# Patient Record
Sex: Female | Born: 1965 | Race: Asian | Hispanic: No | Marital: Married | Smoking: Never smoker
Health system: Southern US, Community
[De-identification: ages and names within clinical notes are randomized; demographics above are authoritative.]

## PROBLEM LIST (undated history)

## (undated) DIAGNOSIS — T7840XA Allergy, unspecified, initial encounter: Secondary | ICD-10-CM

## (undated) DIAGNOSIS — K219 Gastro-esophageal reflux disease without esophagitis: Secondary | ICD-10-CM

## (undated) DIAGNOSIS — R519 Headache, unspecified: Secondary | ICD-10-CM

## (undated) DIAGNOSIS — I10 Essential (primary) hypertension: Secondary | ICD-10-CM

## (undated) DIAGNOSIS — R51 Headache: Secondary | ICD-10-CM

## (undated) HISTORY — PX: TUBAL LIGATION: SHX77

## (undated) HISTORY — DX: Allergy, unspecified, initial encounter: T78.40XA

## (undated) HISTORY — DX: Essential (primary) hypertension: I10

---

## 1998-12-04 ENCOUNTER — Ambulatory Visit (HOSPITAL_COMMUNITY): Admission: RE | Admit: 1998-12-04 | Discharge: 1998-12-04 | Payer: Self-pay | Admitting: General Surgery

## 2000-05-04 ENCOUNTER — Other Ambulatory Visit: Admission: RE | Admit: 2000-05-04 | Discharge: 2000-05-04 | Payer: Self-pay | Admitting: Obstetrics and Gynecology

## 2000-12-08 ENCOUNTER — Emergency Department (HOSPITAL_COMMUNITY): Admission: EM | Admit: 2000-12-08 | Discharge: 2000-12-08 | Payer: Self-pay | Admitting: Emergency Medicine

## 2000-12-08 ENCOUNTER — Encounter: Payer: Self-pay | Admitting: Emergency Medicine

## 2007-05-21 ENCOUNTER — Emergency Department (HOSPITAL_COMMUNITY): Admission: EM | Admit: 2007-05-21 | Discharge: 2007-05-21 | Payer: Self-pay | Admitting: Emergency Medicine

## 2008-05-31 ENCOUNTER — Emergency Department (HOSPITAL_COMMUNITY): Admission: EM | Admit: 2008-05-31 | Discharge: 2008-06-01 | Payer: Self-pay | Admitting: Emergency Medicine

## 2010-07-27 LAB — CSF CELL COUNT WITH DIFFERENTIAL
RBC Count, CSF: 1 /mm3 — ABNORMAL HIGH
Tube #: 4
WBC, CSF: 1 /mm3 (ref 0–5)

## 2010-07-27 LAB — CSF CULTURE W GRAM STAIN: Culture: NO GROWTH

## 2010-07-27 LAB — GRAM STAIN: Gram Stain: NONE SEEN

## 2011-07-31 ENCOUNTER — Ambulatory Visit (INDEPENDENT_AMBULATORY_CARE_PROVIDER_SITE_OTHER): Payer: PRIVATE HEALTH INSURANCE | Admitting: Family Medicine

## 2011-07-31 VITALS — BP 145/88 | HR 80 | Temp 98.9°F | Resp 16 | Ht 60.25 in | Wt 132.4 lb

## 2011-07-31 DIAGNOSIS — J309 Allergic rhinitis, unspecified: Secondary | ICD-10-CM

## 2011-07-31 DIAGNOSIS — K120 Recurrent oral aphthae: Secondary | ICD-10-CM

## 2011-07-31 MED ORDER — FLUTICASONE PROPIONATE 50 MCG/ACT NA SUSP: 2.0000 | Freq: Every day | NASAL | Status: DC

## 2011-07-31 MED ORDER — TRIAMCINOLONE ACETONIDE 0.1 % EX CREA: TOPICAL_CREAM | CUTANEOUS | Status: DC

## 2011-07-31 MED ORDER — LIDOCAINE VISCOUS 2 % MT SOLN: 20.0000 mL | OROMUCOSAL | Status: AC | PRN

## 2011-07-31 NOTE — Progress Notes (Signed)
  Subjective:    Patient ID: Carrie Carpenter, female    DOB: 23-May-1965, 46 y.o.   MRN: [id number]  HPI Patient presents with 4 day history of facial congestion, PND and nocturnal cough that is not productive.  Nonsmoker  Review of Systems  Constitutional: Positive for fever, chills and fatigue.  HENT: Positive for ear pain and congestion.   Eyes: Positive for itching.  Respiratory: Positive for cough.   Skin: Positive for rash (pruritic rash (L) elbow).       Objective:   Physical Exam  Constitutional: She appears well-developed.  HENT:  Right Ear: External ear normal.  Left Ear: External ear normal.  Nose: Mucosal edema (pale purple hue) present.  Mouth/Throat:    Eyes: Conjunctivae are normal.  Neck: Neck supple.  Cardiovascular: Normal rate, regular rhythm and normal heart sounds.   Pulmonary/Chest: Effort normal and breath sounds normal.  Lymphadenopathy:    She has cervical adenopathy (prominant on (L)).  Neurological: She is alert.  Skin: Rash: eczematous changes (L) elbow.        Assessment & Plan:   1. Aphthous ulcer  triamcinolone cream (KENALOG) 0.1 %, lidocaine (XYLOCAINE) 2 % solution  2. Allergic rhinitis  fluticasone (FLONASE) 50 MCG/ACT nasal spray   Anticipatory guidance

## 2012-01-31 ENCOUNTER — Ambulatory Visit: Payer: Self-pay | Admitting: Physician Assistant

## 2012-01-31 ENCOUNTER — Encounter: Payer: Self-pay | Admitting: Physician Assistant

## 2012-01-31 VITALS — BP 154/104 | HR 61 | Temp 98.0°F | Resp 16 | Ht 61.0 in | Wt 128.4 lb

## 2012-01-31 DIAGNOSIS — R030 Elevated blood-pressure reading, without diagnosis of hypertension: Secondary | ICD-10-CM

## 2012-01-31 DIAGNOSIS — R51 Headache: Secondary | ICD-10-CM

## 2012-01-31 LAB — POCT UA - MICROSCOPIC ONLY
Bacteria, U Microscopic: NEGATIVE
Casts, Ur, LPF, POC: NEGATIVE
Crystals, Ur, HPF, POC: NEGATIVE

## 2012-01-31 LAB — GLUCOSE, POCT (MANUAL RESULT ENTRY): POC Glucose: 72 mg/dl (ref 70–99)

## 2012-01-31 LAB — POCT URINALYSIS DIPSTICK
Bilirubin, UA: NEGATIVE
Glucose, UA: NEGATIVE
Ketones, UA: NEGATIVE
Leukocytes, UA: NEGATIVE
Nitrite, UA: NEGATIVE
Protein, UA: NEGATIVE
Spec Grav, UA: 1.015
Urobilinogen, UA: 0.2
pH, UA: 6.5

## 2012-01-31 MED ORDER — TRAZODONE HCL 50 MG PO TABS: 25.0000 mg | ORAL_TABLET | Freq: Every evening | ORAL | Status: DC | PRN

## 2012-01-31 MED ORDER — LISINOPRIL 5 MG PO TABS: 5.0000 mg | ORAL_TABLET | Freq: Every day | ORAL | Status: DC
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
## 2012-01-31 NOTE — Patient Instructions (Addendum)
Please work on getting at least 6 hours of sleep at night, preferably 8-9 hours. Try the trazodone to help you relax and sleep.  Start with 1/2 tablet, and increase it to 1 tablet if needed.

## 2012-02-01 ENCOUNTER — Encounter: Payer: Self-pay | Admitting: Physician Assistant

## 2012-02-01 LAB — COMPREHENSIVE METABOLIC PANEL
Alkaline Phosphatase: 51 U/L (ref 39–117)
CO2: 25 mEq/L (ref 19–32)
Creat: 0.63 mg/dL (ref 0.50–1.10)
Glucose, Bld: 70 mg/dL (ref 70–99)
Sodium: 136 mEq/L (ref 135–145)
Total Bilirubin: 0.4 mg/dL (ref 0.3–1.2)
Total Protein: 7.1 g/dL (ref 6.0–8.3)

## 2012-02-01 LAB — CBC WITH DIFFERENTIAL/PLATELET
Basophils Absolute: 0 10*3/uL (ref 0.0–0.1)
Eosinophils Absolute: 0.1 10*3/uL (ref 0.0–0.7)
Eosinophils Relative: 3 % (ref 0–5)
HCT: 38.5 % (ref 36.0–46.0)
MCH: 26.3 pg (ref 26.0–34.0)
MCV: 80.9 fL (ref 78.0–100.0)
Monocytes Absolute: 0.4 10*3/uL (ref 0.1–1.0)
Platelets: 260 10*3/uL (ref 150–400)
RDW: 15.5 % (ref 11.5–15.5)

## 2012-02-01 LAB — TSH: TSH: 1.243 u[IU]/mL (ref 0.350–4.500)

## 2012-03-09 ENCOUNTER — Ambulatory Visit: Payer: PRIVATE HEALTH INSURANCE

## 2012-03-09 ENCOUNTER — Ambulatory Visit (INDEPENDENT_AMBULATORY_CARE_PROVIDER_SITE_OTHER): Payer: PRIVATE HEALTH INSURANCE | Admitting: Family Medicine

## 2012-03-09 VITALS — BP 130/98 | HR 99 | Temp 99.9°F | Resp 16 | Ht 61.0 in | Wt 126.8 lb

## 2012-03-09 DIAGNOSIS — R05 Cough: Secondary | ICD-10-CM

## 2012-03-09 DIAGNOSIS — J40 Bronchitis, not specified as acute or chronic: Secondary | ICD-10-CM

## 2012-03-09 DIAGNOSIS — R059 Cough, unspecified: Secondary | ICD-10-CM

## 2012-03-09 DIAGNOSIS — R509 Fever, unspecified: Secondary | ICD-10-CM

## 2012-03-09 LAB — POCT CBC
Granulocyte percent: 69.6 %G (ref 37–80)
HCT, POC: 40 % (ref 37.7–47.9)
Hemoglobin: 12.3 g/dL (ref 12.2–16.2)
Lymph, poc: 1 (ref 0.6–3.4)
MCH, POC: 26.1 pg — AB (ref 27–31.2)
MCHC: 30.8 g/dL — AB (ref 31.8–35.4)
MCV: 84.7 fL (ref 80–97)
MID (cbc): 0.3 (ref 0–0.9)
MPV: 10.4 fL (ref 0–99.8)
POC Granulocyte: 3.1 (ref 2–6.9)
POC LYMPH PERCENT: 22.6 %L (ref 10–50)
POC MID %: 7.8 %M (ref 0–12)
Platelet Count, POC: 206 10*3/uL (ref 142–424)
RBC: 4.72 M/uL (ref 4.04–5.48)
RDW, POC: 15.1 %
WBC: 4.4 10*3/uL — AB (ref 4.6–10.2)

## 2012-03-09 LAB — POCT INFLUENZA A/B
Influenza A, POC: NEGATIVE
Influenza B, POC: NEGATIVE

## 2012-03-09 MED ORDER — AZITHROMYCIN 250 MG PO TABS: ORAL_TABLET | ORAL | Status: DC

## 2012-03-09 MED ORDER — HYDROCOD POLST-CHLORPHEN POLST 10-8 MG/5ML PO LQCR: 5.0000 mL | Freq: Two times a day (BID) | ORAL | Status: DC | PRN

## 2012-03-09 NOTE — Progress Notes (Signed)
  Subjective:    Patient ID: Carrie Carpenter, female    DOB: October 21, 1965, 46 y.o.   MRN: [id number]  HPI 46 year old female presents with almost 3 week history of cough.  Was given Cheratussin by the nurse at her work, but states that has not helped her symptoms. She has also been taking OTC cough syrups and Nyquil, which have not helped either.  States last night she developed fever.  Cough is not productive but is persistent and worse at night.  Denies sore throat, otalgia, nasal congestion, nausea, or vomiting.  She is otherwise healthy with no other complaints today.  Denies any known sick contacts.     Review of Systems  Constitutional: Positive for fever and chills.  HENT: Negative for ear pain, congestion, sore throat, rhinorrhea, neck pain, neck stiffness and postnasal drip.   Eyes: Negative for discharge.  Respiratory: Positive for cough. Negative for chest tightness, shortness of breath and wheezing.   Cardiovascular: Negative for chest pain.  All other systems reviewed and are negative.       Objective:   Physical Exam  Constitutional: She is oriented to person, place, and time. She appears well-developed and well-nourished.  HENT:  Head: Normocephalic and atraumatic.  Right Ear: External ear normal.  Left Ear: External ear normal.  Mouth/Throat: Oropharynx is clear and moist. No oropharyngeal exudate.  Eyes: Conjunctivae normal are normal.  Neck: Normal range of motion.  Cardiovascular: Normal rate, regular rhythm and normal heart sounds.   Pulmonary/Chest: Effort normal and breath sounds normal.  Lymphadenopathy:    She has no cervical adenopathy.  Neurological: She is alert and oriented to person, place, and time.  Psychiatric: She has a normal mood and affect. Her behavior is normal. Judgment and thought content normal.     [facility] reading (PRIMARY) by  Dr. Patsy Lager as normal chest x-ray.       Assessment & Plan:   1. Cough  DG Chest 2 View, azithromycin (ZITHROMAX) 250  MG tablet, chlorpheniramine-HYDROcodone (TUSSIONEX PENNKINETIC ER) 10-8 MG/5ML LQCR  2. Fever  POCT CBC, POCT Influenza A/B  3. Bronchitis  azithromycin (ZITHROMAX) 250 MG tablet   Will cover with Zpack Tussionex qhs prn cough Increase fluids and rest Follow up if symptoms persist or if fever >100.0

## 2012-03-13 ENCOUNTER — Encounter: Payer: Self-pay | Admitting: Physician Assistant

## 2012-03-13 ENCOUNTER — Ambulatory Visit (INDEPENDENT_AMBULATORY_CARE_PROVIDER_SITE_OTHER): Payer: PRIVATE HEALTH INSURANCE | Admitting: Physician Assistant

## 2012-03-13 VITALS — BP 116/81 | HR 77 | Temp 98.1°F | Resp 16 | Ht 62.0 in | Wt 124.0 lb

## 2012-03-13 DIAGNOSIS — G4700 Insomnia, unspecified: Secondary | ICD-10-CM

## 2012-03-13 DIAGNOSIS — I10 Essential (primary) hypertension: Secondary | ICD-10-CM

## 2012-03-13 NOTE — Progress Notes (Signed)
Subjective:    Patient ID: Carrie Carpenter, female    DOB: 12-03-1965, 46 y.o.   MRN: [id number]  HPI  This 46 y.o. female presents for evaluation of HTN, HA and insomnia.  She is accompanied by her son, presumably to translate/assist in communication, but he does not participate in any way, and spends the visit on his phone.  Her HA is better since last visit.  However, she developed an illness 5 days ago.  She was evaluated here and diagnosed with bronchitis.  She reports symptom improvement since starting azithromycin and cough syrup, though she is not yet well. I note that she reported 3 weeks of cough to the provider who evaluated her acute symptoms, and wonder if she may be reacting to the ACEI started at her last visit.Sleeping only 4-5 hours at night. The Trazodone works, but she doesn't take it because of residual somnolence in the mornings.  She did not try reducing the dose to 1/2 tablet.  Past Medical History  Diagnosis Date  . Hypertension   . Allergy     Past Surgical History  Procedure Date  . Tubal ligation     Prior to Admission medications   Medication Sig Start Date End Date Taking? Authorizing Provider  azithromycin (ZITHROMAX) 250 MG tablet Take 2 tabs PO x 1 dose, then 1 tab PO QD x 4 days 03/09/12   Carrie Nay, PA-C  chlorpheniramine-HYDROcodone (TUSSIONEX PENNKINETIC ER) 10-8 MG/5ML LQCR Take 5 mLs by mouth every 12 (twelve) hours as needed (cough). 03/09/12   Carrie Jaquita Rector, PA-C  DiphenhydrAMINE HCl (BENADRYL ALLERGY PO) Take 1 tablet by mouth daily as needed.    Historical Provider, MD  fluticasone (FLONASE) 50 MCG/ACT nasal spray Place 2 sprays into the nose daily. 07/31/11 07/30/12  Carrie Davenport, MD  lisinopril (PRINIVIL,ZESTRIL) 5 MG tablet Take 1 tablet (5 mg total) by mouth daily. 01/31/12   Carrie Benninger S Braun Rocca, PA-C  traZODone (DESYREL) 50 MG tablet Take 0.5-1 tablets (25-50 mg total) by mouth at bedtime as needed for sleep. 01/31/12   Carrie Sardinha Tessa Lerner,  PA-C    No Known Allergies  History   Social History  . Marital Status: Married    Spouse Name: Carrie Carpenter    Number of Children: 3  . 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 Smoking status: Never Smoker   . Smokeless tobacco: Never Used  . Alcohol Use: No  . Drug Use: No   Other Topics Concern  . Not on file   Social History Narrative   Lives with husband and 3 children. From [place]; in the [place] since 1989.    History reviewed. No pertinent family history.  Review of Systems As above.  No chest pain, SOB,dizziness, vision change, N/V, diarrhea, constipation, dysuria, urinary urgency or frequency, myalgias, arthralgias or rash.     Objective:   Physical Exam Blood pressure 116/81, pulse 77, temperature 98.1 F (36.7 C), resp. rate 16, height 5\' 2"  (1.575 m), weight 124 lb (56.246 kg), last menstrual period 02/27/2012. Body mass index is 22.68 kg/(m^2). Well-developed, well nourished [place] female who is awake, alert and oriented, in NAD. HEENT: [place]/AT, sclera and conjunctiva are clear.   Neck: supple, non-tender, no lymphadenopathy, thyromegaly. Heart: RRR, no murmur Lungs: normal effort, CTA Extremities: no cyanosis, clubbing or edema. Skin: warm and dry without rash. Psychologic: good mood  and appropriate affect, normal speech and behavior.     Assessment & Plan:   1. HTN (hypertension)   2. Insomnia    Continue lisinopril for now, though if her cough persists, we may need to change that to an alternate agent.  While she is no longer symptomatic from a HA perspective, she is still not sleeping much.  She may try taking 1/2 tablet of the trazodone to see if it effective but with less morning effect.  RTC 3 months, plan CMET, sooner PRN.

## 2012-03-13 NOTE — Patient Instructions (Addendum)
Try taking 1/2 of the Trazodone tablet at bedtime to help you sleep-it may not give you the morning symptoms at the lower dose.

## 2012-03-23 ENCOUNTER — Ambulatory Visit (INDEPENDENT_AMBULATORY_CARE_PROVIDER_SITE_OTHER): Payer: PRIVATE HEALTH INSURANCE | Admitting: Family Medicine

## 2012-03-23 ENCOUNTER — Encounter: Payer: Self-pay | Admitting: Family Medicine

## 2012-03-23 VITALS — BP 130/80 | HR 81 | Temp 99.0°F | Resp 17 | Ht 61.0 in | Wt 121.0 lb

## 2012-03-23 DIAGNOSIS — R059 Cough, unspecified: Secondary | ICD-10-CM

## 2012-03-23 DIAGNOSIS — Z789 Other specified health status: Secondary | ICD-10-CM

## 2012-03-23 DIAGNOSIS — Z609 Problem related to social environment, unspecified: Secondary | ICD-10-CM

## 2012-03-23 DIAGNOSIS — R05 Cough: Secondary | ICD-10-CM

## 2012-03-23 DIAGNOSIS — J45909 Unspecified asthma, uncomplicated: Secondary | ICD-10-CM

## 2012-03-23 MED ORDER — HYDROCODONE-HOMATROPINE 5-1.5 MG/5ML PO SYRP: 5.0000 mL | ORAL_SOLUTION | ORAL | Status: DC | PRN

## 2012-03-23 MED ORDER — PREDNISONE 20 MG PO TABS: ORAL_TABLET | ORAL | Status: DC

## 2012-03-23 MED ORDER — ALBUTEROL SULFATE (2.5 MG/3ML) 0.083% IN NEBU
2.5000 mg | INHALATION_SOLUTION | Freq: Once | RESPIRATORY_TRACT | Status: AC
  Administered 2012-03-23: 2.5 mg via RESPIRATORY_TRACT

## 2012-03-23 MED ORDER — ALBUTEROL SULFATE HFA 108 (90 BASE) MCG/ACT IN AERS: INHALATION_SPRAY | RESPIRATORY_TRACT | Status: DC

## 2012-03-23 NOTE — Progress Notes (Signed)
Subjective: 46 year old [place] lady who is here with a cough. She came in first time almost 3 weeks ago. She was evaluated with a CBC and chest x-ray and flu test. She was treated with Zithromax. She continued having a cough came back in about 2 weeks ago. She  received further symptomatic treatment. Discussion was held that if the cough persisted they might have to stop her lisinopril. She apparently stopped her lisinopril last week. She does not smoke. She had a fever a week ago. She coughs so hard that she vomits. She has not been able to sleep well because of the coughing. She did continue to work this week. She has not had a lot of trouble with persistent coughs of the years. Has not traveled abroad.  Objective: Pleasant lady in no major distress. TMs normal. Throat clear. Neck supple without nodes. Chest is clear to auscultation. Heart regular without murmurs. Abdomen soft without mass or tenderness. Last menstrual period was about a month ago but she has had her tubes tied.  Peak flow 380, not very reproducable, desired 420  Assessment: Bronchitis Sleep disturbance Vomiting secondary to cough  Plan: Trial of albuterol  Post treatment feels better but 320 peak flow.   This is actually a better reading, and the 380 was maybe factitious.  Albuterol, prednisone, cough med

## 2012-03-23 NOTE — Patient Instructions (Signed)
Use the inhaler 2 puffs 4 times daily at breakfast, lunch, supper and bedtime  Take the prednisone as instructed. Take 3 pills today, 3 Saturday, to Sunday, 2 Monday, one daily, and 1 Wednesday  Use the cough syrup primarily when you're not working because it will make you sleepy  If you are running more fevers or coughing up more mucus or getting more shortness of breath return for one more recheck

## 2012-03-30 ENCOUNTER — Ambulatory Visit (INDEPENDENT_AMBULATORY_CARE_PROVIDER_SITE_OTHER): Payer: PRIVATE HEALTH INSURANCE | Admitting: Family Medicine

## 2012-03-30 ENCOUNTER — Ambulatory Visit: Payer: PRIVATE HEALTH INSURANCE

## 2012-03-30 VITALS — BP 128/74 | HR 72 | Temp 98.4°F | Resp 18 | Ht 62.25 in | Wt 125.0 lb

## 2012-03-30 DIAGNOSIS — S239XXA Sprain of unspecified parts of thorax, initial encounter: Secondary | ICD-10-CM

## 2012-03-30 DIAGNOSIS — S139XXA Sprain of joints and ligaments of unspecified parts of neck, initial encounter: Secondary | ICD-10-CM

## 2012-03-30 DIAGNOSIS — S161XXA Strain of muscle, fascia and tendon at neck level, initial encounter: Secondary | ICD-10-CM

## 2012-03-30 DIAGNOSIS — S29012A Strain of muscle and tendon of back wall of thorax, initial encounter: Secondary | ICD-10-CM

## 2012-03-30 DIAGNOSIS — M542 Cervicalgia: Secondary | ICD-10-CM

## 2012-03-30 MED ORDER — METAXALONE 800 MG PO TABS: 800.0000 mg | ORAL_TABLET | Freq: Three times a day (TID) | ORAL | Status: DC

## 2012-03-30 MED ORDER — OXAPROZIN 600 MG PO TABS: ORAL_TABLET | ORAL | Status: DC
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
## 2012-03-30 NOTE — Patient Instructions (Signed)
Take the Skelaxin muscle relaxant one half to one pill 3 times daily  Take the Daypro one twice daily for pain and inflammation. If continued to hurt to take some Tylenol in addition to this, but do not take Advil, Aleve, ibuprofen with the Daypro.  Do stretching exercises  Return if worse or no better

## 2012-04-07 ENCOUNTER — Other Ambulatory Visit: Payer: Self-pay | Admitting: Family Medicine

## 2012-04-10 ENCOUNTER — Other Ambulatory Visit: Payer: Self-pay | Admitting: Family Medicine

## 2012-06-19 ENCOUNTER — Ambulatory Visit: Payer: PRIVATE HEALTH INSURANCE | Admitting: Physician Assistant

## 2013-03-19 ENCOUNTER — Ambulatory Visit: Payer: Self-pay | Admitting: *Deleted

## 2013-08-11 ENCOUNTER — Emergency Department (HOSPITAL_COMMUNITY)
Admission: EM | Admit: 2013-08-11 | Discharge: 2013-08-12 | Disposition: A | Discharge status: Home / Self Care | Payer: PRIVATE HEALTH INSURANCE | Attending: Emergency Medicine | Admitting: Emergency Medicine

## 2013-08-11 ENCOUNTER — Encounter (HOSPITAL_COMMUNITY): Payer: Self-pay | Admitting: Emergency Medicine

## 2013-08-11 DIAGNOSIS — IMO0002 Reserved for concepts with insufficient information to code with codable children: Secondary | ICD-10-CM | POA: Insufficient documentation

## 2013-08-11 DIAGNOSIS — M62838 Other muscle spasm: Secondary | ICD-10-CM

## 2013-08-11 DIAGNOSIS — Z79899 Other long term (current) drug therapy: Secondary | ICD-10-CM | POA: Insufficient documentation

## 2013-08-11 DIAGNOSIS — R519 Headache, unspecified: Secondary | ICD-10-CM

## 2013-08-11 DIAGNOSIS — J069 Acute upper respiratory infection, unspecified: Secondary | ICD-10-CM

## 2013-08-11 DIAGNOSIS — R110 Nausea: Secondary | ICD-10-CM | POA: Insufficient documentation

## 2013-08-11 DIAGNOSIS — R51 Headache: Secondary | ICD-10-CM

## 2013-08-11 DIAGNOSIS — I10 Essential (primary) hypertension: Secondary | ICD-10-CM | POA: Insufficient documentation

## 2013-08-11 MED ORDER — METOCLOPRAMIDE HCL 5 MG/ML IJ SOLN
10.0000 mg | Freq: Once | INTRAMUSCULAR | Status: AC
  Administered 2013-08-12: 10 mg via INTRAMUSCULAR
  Filled 2013-08-11: qty 2

## 2013-08-11 MED ORDER — DIPHENHYDRAMINE HCL 50 MG/ML IJ SOLN
25.0000 mg | Freq: Once | INTRAMUSCULAR | Status: AC
  Administered 2013-08-12: 25 mg via INTRAMUSCULAR
  Filled 2013-08-11: qty 1

## 2013-08-11 NOTE — ED Notes (Signed)
Pt arrived to the Ed with a complaint of a headache.  Pt has a history of migraines.  Pt states headache started around 1400.  Pt states she has seen a neurologist without relief.  Pt has a cough.  Pt is hypertensive in triage.

## 2013-08-11 NOTE — ED Notes (Signed)
Pt presents with c/o headache since 2pm this afternoon.

## 2013-08-11 NOTE — ED Provider Notes (Signed)
CSN: [id number]     Arrival date & time 08/11/13  2152 History   First MD Initiated Contact with Patient 08/11/13 2303     Chief Complaint  Patient presents with  . Migraine     (Consider location/radiation/quality/duration/timing/severity/associated sxs/prior Treatment) HPI Comments: Patient is a 48 year old female medical history significant for hypertension, headaches, seasonal allergies presenting to the emergency department for two complaints. Patient's first complaint is bilateral next item that began around 1 PM this afternoon. Patient states that pain is now radiating into her head. She has tried Tylenol with no improvement of her symptoms. Patient states typically her headaches are on top of her head rather than posterior. She is unsure whether she slept wrong on her neck. She does have associated nausea without vomiting and Patient's second complaint is one week of nonproductive cough with associated rhinorrhea sneezing, nasal congestion. She states her coughing is worse at night. Has not tried anything over-the-counter for her symptoms. She does have family who has had upper respiratory infections. She denies any fevers, chills, chest pain, shortness of breath, abdominal pain, neck stiffness  Patient is a 48 y.o. female presenting with migraines.  Migraine Associated symptoms include congestion, coughing and headaches. Pertinent negatives include no chills, fever, numbness or weakness.    Past Medical History  Diagnosis Date  . Hypertension   . Allergy    Past Surgical History  Procedure Laterality Date  . Tubal ligation     History reviewed. No pertinent family history. History  Substance Use Topics  . Smoking status: Never Smoker   . Smokeless tobacco: Never Used  . Alcohol Use: No   OB History   Grav Para Term Preterm Abortions TAB SAB Ect Mult Living                 Review of Systems  Constitutional: Negative for fever and chills.  HENT: Positive for congestion,  rhinorrhea and sneezing.   Respiratory: Positive for cough.   Neurological: Positive for headaches. Negative for weakness and numbness.  All other systems reviewed and are negative.     Allergies  Review of patient's allergies indicates no known allergies.  Home Medications   Prior to Admission medications   Medication Sig Start Date End Date Taking? Authorizing Provider  albuterol (PROVENTIL HFA;VENTOLIN HFA) 108 (90 BASE) MCG/ACT inhaler Use 2 puffs four times a day as instructed for lungs 03/23/12   Posey Boyer, MD  azithromycin (ZITHROMAX) 250 MG tablet Take 2 tabs PO x 1 dose, then 1 tab PO QD x 4 days 03/09/12   Collene Leyden, PA-C  chlorpheniramine-HYDROcodone (TUSSIONEX PENNKINETIC ER) 10-8 MG/5ML LQCR Take 5 mLs by mouth every 12 (twelve) hours as needed (cough). 03/09/12   Heather Elnora Morrison, PA-C  DiphenhydrAMINE HCl (BENADRYL ALLERGY PO) Take 1 tablet by mouth daily as needed.    Historical Provider, MD  fluticasone (FLONASE) 50 MCG/ACT nasal spray Place 2 sprays into the nose daily. 07/31/11 07/30/12  Hayden Rasmussen, MD  HYDROcodone-homatropine [facility]) 5-1.5 MG/5ML syrup Take 5 mLs by mouth every 4 (four) hours as needed for cough. 03/23/12   Posey Boyer, MD  lisinopril (PRINIVIL,ZESTRIL) 5 MG tablet Take 1 tablet (5 mg total) by mouth daily. 01/31/12   Chelle S Jeffery, PA-C  metaxalone (SKELAXIN) 800 MG tablet Take 1 tablet (800 mg total) by mouth 3 (three) times daily. 03/30/12   Posey Boyer, MD  oxaprozin (DAYPRO) 600 MG tablet Take one twice daily with food for  pain and inflammation 03/30/12   Posey Boyer, MD  predniSONE (DELTASONE) 20 MG tablet Take 3 daily for 2 days, then 2 daily for 2 days, then 1 daily for 2 days for lungs 03/23/12   Posey Boyer, MD  traZODone (DESYREL) 50 MG tablet Take 0.5-1 tablets (25-50 mg total) by mouth at bedtime as needed for sleep. 01/31/12   Chelle S Jeffery, PA-C   BP 177/92  Pulse 78  Temp(Src) 97.9 F (36.6 C) (Oral)   Resp 20  SpO2 100%  LMP 07/19/2013 Physical Exam  Nursing note and vitals reviewed. Constitutional: She is oriented to person, place, and time. She appears well-developed and well-nourished. No distress.  Patient sitting in the room talking with family in room w/ all lights on. In no acute distress.  HENT:  Head: Normocephalic and atraumatic.  Right Ear: External ear normal.  Left Ear: External ear normal.  Nose: Nose normal.  Mouth/Throat: Oropharynx is clear and moist. No oropharyngeal exudate.  Eyes: Conjunctivae and EOM are normal. Pupils are equal, round, and reactive to light.  Neck: Normal range of motion and full passive range of motion without pain. Neck supple. Muscular tenderness present. No spinous process tenderness present. No rigidity. No edema, no erythema and normal range of motion present.    Cardiovascular: Normal rate, regular rhythm, normal heart sounds and intact distal pulses.   Pulmonary/Chest: Effort normal and breath sounds normal. No stridor. No respiratory distress.  Dry cough appreciated on examination.  Abdominal: Soft. There is no tenderness.  Musculoskeletal: Normal range of motion. She exhibits no edema.       Cervical back: She exhibits spasm. She exhibits normal range of motion, no tenderness, no bony tenderness, no swelling, no deformity, no laceration, no pain and normal pulse.  Lymphadenopathy:    She has no cervical adenopathy.  Neurological: She is alert and oriented to person, place, and time. She has normal strength. No cranial nerve deficit. Gait normal. GCS eye subscore is 4. GCS verbal subscore is 5. GCS motor subscore is 6.  Sensation grossly intact.  No pronator drift.  Bilateral heel-knee-shin intact.  Skin: Skin is warm and dry. She is not diaphoretic.    ED Course  Procedures (including critical care time) Medications  diazepam (VALIUM) tablet 5 mg (not administered)  metoCLOPramide (REGLAN) injection 10 mg (10 mg Intramuscular  Given 08/12/13 0001)  diphenhydrAMINE (BENADRYL) injection 25 mg (25 mg Intramuscular Given 08/12/13 0003)    Labs Review Labs Reviewed - No data to display  Imaging Review Dg Chest 2 View  08/12/2013   CLINICAL DATA:  Cough and congestion  EXAM: CHEST  2 VIEW  COMPARISON:  03/09/2012  FINDINGS: Normal heart size and mediastinal contours. No acute infiltrate or edema. No effusion or pneumothorax. No acute osseous findings.  IMPRESSION: No active cardiopulmonary disease.   Electronically Signed   By: Jorje Guild M.D.   On: 08/12/2013 00:17     EKG Interpretation None      MDM   Final diagnoses:  URI (upper respiratory infection)  Muscle spasms of neck  Headache    Filed Vitals:   08/12/13 0005  BP: 177/92  Pulse: 78  Temp:   Resp: 20   Afebrile, NAD, non-toxic appearing, AAOx4.   1) Muscle spasm and headache: Pt is afebrile with no focal neuro deficits, nuchal rigidity, or change in vision. Muscle spasm appreciated the bilateral cervical paraspinal muscles. No posterior midline tenderness. Symptoms improved while in ED.  2)  URI: Pt CXR negative for acute infiltrate. Patients symptoms are consistent with URI, likely viral etiology. Discussed that antibiotics are not indicated for viral infections.   Pt will be discharged with symptomatic treatment. Return Precautions discussed. Verbalizes understanding and is agreeable with plan. Pt is hemodynamically stable & in NAD prior to [place], PA-C 08/12/13 0122

## 2013-08-12 ENCOUNTER — Emergency Department (HOSPITAL_COMMUNITY): Payer: PRIVATE HEALTH INSURANCE

## 2013-08-12 MED ORDER — LORATADINE 10 MG PO TABS: 10.0000 mg | ORAL_TABLET | Freq: Every day | ORAL | Status: DC

## 2013-08-12 MED ORDER — DIAZEPAM 5 MG PO TABS
5.0000 mg | ORAL_TABLET | Freq: Once | ORAL | Status: AC
  Administered 2013-08-12: 5 mg via ORAL
  Filled 2013-08-12: qty 1

## 2013-08-12 MED ORDER — DIAZEPAM 5 MG PO TABS: 5.0000 mg | ORAL_TABLET | Freq: Two times a day (BID) | ORAL | Status: DC

## 2013-08-12 MED ORDER — GUAIFENESIN 100 MG/5ML PO LIQD: 100.0000 mg | ORAL | Status: DC | PRN

## 2013-08-12 NOTE — ED Provider Notes (Signed)
Medical screening examination/treatment/procedure(s) were performed by non-physician practitioner and as supervising physician I was immediately available for consultation/collaboration.   EKG Interpretation None        Jaydenn Boccio, MD 08/12/13 0532 

## 2013-08-12 NOTE — Discharge Instructions (Signed)
Please follow up with your primary care physician in 1-2 days. If you do not have one please call the [place] number listed above. Please take pain medication and/or muscle relaxants as prescribed and as needed for pain. Please do not drive on narcotic pain medication or on muscle relaxants. Please use robitussin and claritin as prescribed. Please read all discharge instructions and return precautions.   Muscle Cramps and Spasms Muscle cramps and spasms occur when a muscle or muscles tighten and you have no control over this tightening (involuntary muscle contraction). They are a common problem and can develop in any muscle. The most common place is in the calf muscles of the leg. Both muscle cramps and muscle spasms are involuntary muscle contractions, but they also have differences:   Muscle cramps are sporadic and painful. They may last a few seconds to a quarter of an hour. Muscle cramps are often more forceful and last longer than muscle spasms.  Muscle spasms may or may not be painful. They may also last just a few seconds or much longer. CAUSES  It is uncommon for cramps or spasms to be due to a serious underlying problem. In many cases, the cause of cramps or spasms is unknown. Some common causes are:   Overexertion.   Overuse from repetitive motions (doing the same thing over and over).   Remaining in a certain position for a long period of time.   Improper preparation, form, or technique while performing a sport or activity.   Dehydration.   Injury.   Side effects of some medicines.   Abnormally low levels of the salts and ions in your blood (electrolytes), especially potassium and calcium. This could happen if you are taking water pills (diuretics) or you are pregnant.  Some underlying medical problems can make it more likely to develop cramps or spasms. These include, but are not limited to:   Diabetes.   Parkinson disease.   Hormone  disorders, such as thyroid problems.   Alcohol abuse.   Diseases specific to muscles, joints, and bones.   Blood vessel disease where not enough blood is getting to the muscles.  HOME CARE INSTRUCTIONS   Stay well hydrated. Drink enough water and fluids to keep your urine clear or pale yellow.  It may be helpful to massage, stretch, and relax the affected muscle.  For tight or tense muscles, use a warm towel, heating pad, or hot shower water directed to the affected area.  If you are sore or have pain after a cramp or spasm, applying ice to the affected area may relieve discomfort.  Put ice in a plastic bag.  Place a towel between your skin and the bag.  Leave the ice on for 15-20 minutes, 03-04 times a day.  Medicines used to treat a known cause of cramps or spasms may help reduce their frequency or severity. Only take over-the-counter or prescription medicines as directed by your caregiver. SEEK MEDICAL CARE IF:  Your cramps or spasms get more severe, more frequent, or do not improve over time.  MAKE SURE YOU:   Understand these instructions.  Will watch your condition.  Will get help right away if you are not doing well or get worse. Document Released: 09/17/2001 Document Revised: 07/23/2012 Document Reviewed: 03/14/2012 [facility] Patient Information 2014 [place], [place].   Upper Respiratory Infection, Adult An upper respiratory infection (URI) is also known as the common cold. It is often caused by a type of germ (virus).  Colds are easily spread (contagious). You can pass it to others by kissing, coughing, sneezing, or drinking out of the same glass. Usually, you get better in 1 or 2 weeks.  HOME CARE   Only take medicine as told by your doctor.  Use a warm mist humidifier or breathe in steam from a hot shower.  Drink enough water and fluids to keep your pee (urine) clear or pale yellow.  Get plenty of rest.  Return to work when your temperature is back to  normal or as told by your doctor. You may use a face mask and wash your hands to stop your cold from spreading. GET HELP RIGHT AWAY IF:   After the first few days, you feel you are getting worse.  You have questions about your medicine.  You have chills, shortness of breath, or brown or red spit (mucus).  You have yellow or brown snot (nasal discharge) or pain in the face, especially when you bend forward.  You have a fever, puffy (swollen) neck, pain when you swallow, or white spots in the back of your throat.  You have a bad headache, ear pain, sinus pain, or chest pain.  You have a high-pitched whistling sound when you breathe in and out (wheezing).  You have a lasting cough or cough up blood.  You have sore muscles or a stiff neck. MAKE SURE YOU:   Understand these instructions.  Will watch your condition.  Will get help right away if you are not doing well or get worse. Document Released: 09/14/2007 Document Revised: 06/20/2011 Document Reviewed: 08/02/2010 [facility] Patient Information 2014 [place], [place].

## 2013-08-15 ENCOUNTER — Ambulatory Visit (INDEPENDENT_AMBULATORY_CARE_PROVIDER_SITE_OTHER): Payer: PRIVATE HEALTH INSURANCE | Admitting: Family Medicine

## 2013-08-15 VITALS — BP 166/90 | HR 96 | Temp 98.5°F | Resp 16 | Ht 61.0 in | Wt 128.0 lb

## 2013-08-15 DIAGNOSIS — J302 Other seasonal allergic rhinitis: Secondary | ICD-10-CM

## 2013-08-15 DIAGNOSIS — J309 Allergic rhinitis, unspecified: Secondary | ICD-10-CM

## 2013-08-15 DIAGNOSIS — J45909 Unspecified asthma, uncomplicated: Secondary | ICD-10-CM

## 2013-08-15 DIAGNOSIS — I10 Essential (primary) hypertension: Secondary | ICD-10-CM

## 2013-08-15 DIAGNOSIS — R059 Cough, unspecified: Secondary | ICD-10-CM

## 2013-08-15 DIAGNOSIS — J019 Acute sinusitis, unspecified: Secondary | ICD-10-CM

## 2013-08-15 DIAGNOSIS — R05 Cough: Secondary | ICD-10-CM

## 2013-08-15 MED ORDER — BENZONATATE 100 MG PO CAPS: 200.0000 mg | ORAL_CAPSULE | Freq: Two times a day (BID) | ORAL | Status: DC | PRN

## 2013-08-15 MED ORDER — LISINOPRIL 5 MG PO TABS: 5.0000 mg | ORAL_TABLET | Freq: Every day | ORAL | Status: DC

## 2013-08-15 MED ORDER — MONTELUKAST SODIUM 10 MG PO TABS: 10.0000 mg | ORAL_TABLET | Freq: Every day | ORAL | Status: DC

## 2013-08-15 MED ORDER — HYDROCODONE-HOMATROPINE 5-1.5 MG/5ML PO SYRP: 5.0000 mL | ORAL_SOLUTION | Freq: Four times a day (QID) | ORAL | Status: DC | PRN

## 2013-08-15 MED ORDER — AMOXICILLIN-POT CLAVULANATE 875-125 MG PO TABS: 1.0000 | ORAL_TABLET | Freq: Two times a day (BID) | ORAL | Status: DC

## 2013-08-15 NOTE — Progress Notes (Signed)
Chief Complaint:  Chief Complaint  Patient presents with  . Cough    X 3 days  . Sore Throat    X 3 days  . Nasal Congestion    X 3 days    HPI: Carrie Carpenter is a 48 y.o. female who is here for 3 day history of  Sore throat and sinus congestion, she had a severe sinus headache so went to the ER on 08/11/13. Chest xray was normal on that visit.  Her headache is resolved now. However her cough is bothering her, she has some productive yellow sputum. She denies fevers or chills, just feels tired overall. She has been taking claritin without relief, she does have some facial tenderness She has not been taking her  BP meds, she has not been on it for several months. She denies having a dry cough with her ACEI when she was taking it.    EXAM:  CHEST 2 VIEW  COMPARISON: 03/09/2012  FINDINGS:  Normal heart size and mediastinal contours. No acute infiltrate or  edema. No effusion or pneumothorax. No acute osseous findings.  IMPRESSION:  No active cardiopulmonary disease.  Electronically Signed  By: Jorje Guild M.D.  On: 08/12/2013 00:17    Past Medical History  Diagnosis Date  . Hypertension   . Allergy    Past Surgical History  Procedure Laterality Date  . Tubal ligation     History   Social History  . Marital Status: Married    Spouse Name: Narom    Number of Children: 3  . Years of Education: 5   Occupational History  . 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 Smoking status: Never Smoker   . Smokeless tobacco: Never Used  . Alcohol Use: No  . Drug Use: No  . Sexual Activity: None   Other Topics Concern  . None   Social History Narrative   Lives with husband and 3 children. From [place]; in the [place] since 1989.   History reviewed. No pertinent family history. No Known Allergies Prior to Admission medications   Medication Sig Start Date End Date Taking? Authorizing Provider  diazepam (VALIUM) 5 MG  tablet Take 1 tablet (5 mg total) by mouth 2 (two) times daily. 08/12/13  Yes Jennifer L Piepenbrink, PA-C  loratadine (CLARITIN) 10 MG tablet Take 1 tablet (10 mg total) by mouth daily. 08/12/13  Yes Jennifer L Piepenbrink, PA-C  amoxicillin-clavulanate (AUGMENTIN) 875-125 MG per tablet Take 1 tablet by mouth 2 (two) times daily. 08/15/13   Sayward Horvath P Jaret Coppedge, DO  benzonatate (TESSALON) 100 MG capsule Take 2 capsules (200 mg total) by mouth 2 (two) times daily as needed for cough. 08/15/13   Kora Groom P [place] Warriner, DO  guaiFENesin (ROBITUSSIN) 100 MG/5ML liquid Take 5-10 mLs (100-200 mg total) by mouth every 4 (four) hours as needed for cough. 08/12/13   Jennifer L Piepenbrink, PA-C  HYDROcodone-homatropine (HYCODAN) 5-1.5 MG/5ML syrup Take 5 mLs by mouth every 6 (six) hours as needed for cough. 08/15/13   Noel Rodier P Lutricia Widjaja, DO  lisinopril (PRINIVIL,ZESTRIL) 5 MG tablet Take 1 tablet (5 mg total) by mouth daily. 08/15/13   Radames Mejorado P Arin Peral, DO  montelukast (SINGULAIR) 10 MG tablet Take 1 tablet (10 mg total) by mouth at bedtime. 08/15/13   Zenaida Tesar P Abdifatah Colquhoun, DO     ROS: The patient denies fevers, chills, night sweats, unintentional weight loss, chest pain, palpitations, wheezing, dyspnea  on exertion, nausea, vomiting, abdominal pain, dysuria, hematuria, melena, numbness, weakness, or tingling.   All other systems have been reviewed and were otherwise negative with the exception of those mentioned in the HPI and as above.    PHYSICAL EXAM: Filed Vitals:   08/15/13 0956  BP: 166/90  Pulse: 96  Temp: 98.5 F (36.9 C)  Resp: 16   Filed Vitals:   08/15/13 0956  Height: 5\' 1"  (1.549 m)  Weight: 128 lb (58.06 kg)   Body mass index is 24.2 kg/(m^2).  General: Alert, no acute distress HEENT:  Normocephalic, atraumatic, oropharynx patent. EOMI, PERRLA, + sinus tenderness, TM nl, no exudates.  Cardiovascular:  Regular rate and rhythm, no rubs murmurs or gallops.  No Carotid bruits, radial pulse intact. No pedal edema.  Respiratory: Clear to  auscultation bilaterally.  No wheezes, rales, or rhonchi.  No cyanosis, no use of accessory musculature GI: No organomegaly, abdomen is soft and non-tender, positive bowel sounds.  No masses. Skin: No rashes. Neurologic: Facial musculature symmetric. Psychiatric: Patient is appropriate throughout our interaction. Lymphatic: No cervical lymphadenopathy Musculoskeletal: Gait intact.   LABS: Results for orders placed in visit on 03/09/12  POCT CBC      Result Value Ref Range   WBC 4.4 (*) 4.6 - 10.2 K/uL   Lymph, poc 1.0  0.6 - 3.4   POC LYMPH PERCENT 22.6  10 - 50 %L   MID (cbc) 0.3  0 - 0.9   POC MID % 7.8  0 - 12 %M   POC Granulocyte 3.1  2 - 6.9   Granulocyte percent 69.6  37 - 80 %G   RBC 4.72  4.04 - 5.48 M/uL   Hemoglobin 12.3  12.2 - 16.2 g/dL   HCT, POC 40.0  37.7 - 47.9 %   MCV 84.7  80 - 97 fL   MCH, POC 26.1 (*) 27 - 31.2 pg   MCHC 30.8 (*) 31.8 - 35.4 g/dL   RDW, POC 15.1     Platelet Count, POC 206  142 - 424 K/uL   MPV 10.4  0 - 99.8 fL  POCT INFLUENZA A/B      Result Value Ref Range   Influenza A, POC Negative     Influenza B, POC Negative       EKG/XRAY:   Primary read interpreted by Dr. Marin Comment at [facility].   ASSESSMENT/PLAN: Encounter Diagnoses  Name Primary?  . Cough Yes  . Asthmatic bronchitis   . Sinusitis, acute   . Seasonal allergies   . HTN (hypertension)    Rx Singulair , c/w claritn, she declines because  flonase makes her want to vomit Rx Augmentin, tessaolon , hycodan F/u prn or in 1 month for BP recheck  Labs pending   Gross sideeffects, risk and benefits, and alternatives of medications d/w patient. Patient is aware that all medications have potential sideeffects and we are unable to predict every sideeffect or drug-drug interaction that may occur.  Glenford Bayley, DO 08/15/2013 11:52 AM

## 2013-08-15 NOTE — Patient Instructions (Addendum)
Sinusitis Sinusitis is redness, soreness, and swelling (inflammation) of the paranasal sinuses. Paranasal sinuses are air pockets within the bones of your face (beneath the eyes, the middle of the forehead, or above the eyes). In healthy paranasal sinuses, mucus is able to drain out, and air is able to circulate through them by way of your nose. However, when your paranasal sinuses are inflamed, mucus and air can become trapped. This can allow bacteria and other germs to grow and cause infection. Sinusitis can develop quickly and last only a short time (acute) or continue over a long period (chronic). Sinusitis that lasts for more than 12 weeks is considered chronic.  CAUSES  Causes of sinusitis include:  Allergies.  Structural abnormalities, such as displacement of the cartilage that separates your nostrils (deviated septum), which can decrease the air flow through your nose and sinuses and affect sinus drainage.  Functional abnormalities, such as when the small hairs (cilia) that line your sinuses and help remove mucus do not work properly or are not present. SYMPTOMS  Symptoms of acute and chronic sinusitis are the same. The primary symptoms are pain and pressure around the affected sinuses. Other symptoms include:  Upper toothache.  Earache.  Headache.  Bad breath.  Decreased sense of smell and taste.  A cough, which worsens when you are lying flat.  Fatigue.  Fever.  Thick drainage from your nose, which often is green and may contain pus (purulent).  Swelling and warmth over the affected sinuses. DIAGNOSIS  Your caregiver will perform a physical exam. During the exam, your caregiver may:  Look in your nose for signs of abnormal growths in your nostrils (nasal polyps).  Tap over the affected sinus to check for signs of infection.  View the inside of your sinuses (endoscopy) with a special imaging device with a light attached (endoscope), which is inserted into your  sinuses. If your caregiver suspects that you have chronic sinusitis, one or more of the following tests may be recommended:  Allergy tests.  Nasal culture A sample of mucus is taken from your nose and sent to a lab and screened for bacteria.  Nasal cytology A sample of mucus is taken from your nose and examined by your caregiver to determine if your sinusitis is related to an allergy. TREATMENT  Most cases of acute sinusitis are related to a viral infection and will resolve on their own within 10 days. Sometimes medicines are prescribed to help relieve symptoms (pain medicine, decongestants, nasal steroid sprays, or saline sprays).  However, for sinusitis related to a bacterial infection, your caregiver will prescribe antibiotic medicines. These are medicines that will help kill the bacteria causing the infection.  Rarely, sinusitis is caused by a fungal infection. In theses cases, your caregiver will prescribe antifungal medicine. For some cases of chronic sinusitis, surgery is needed. Generally, these are cases in which sinusitis recurs more than 3 times per year, despite other treatments. HOME CARE INSTRUCTIONS   Drink plenty of water. Water helps thin the mucus so your sinuses can drain more easily.  Use a humidifier.  Inhale steam 3 to 4 times a day (for example, sit in the bathroom with the shower running).  Apply a warm, moist washcloth to your face 3 to 4 times a day, or as directed by your caregiver.  Use saline nasal sprays to help moisten and clean your sinuses.  Take over-the-counter or prescription medicines for pain, discomfort, or fever only as directed by your caregiver. SEEK IMMEDIATE MEDICAL   CARE IF:  You have increasing pain or severe headaches.  You have nausea, vomiting, or drowsiness.  You have swelling around your face.  You have vision problems.  You have a stiff neck.  You have difficulty breathing. MAKE SURE YOU:   Understand these  instructions.  Will watch your condition.  Will get help right away if you are not doing well or get worse. Document Released: 03/28/2005 Document Revised: 06/20/2011 Document Reviewed: 04/12/2011 [facility] Patient Information 2014 [place], [place]. Hypertension Hypertension is another name for high blood pressure. High blood pressure may mean that your heart needs to work harder to pump blood. Blood pressure consists of two numbers, which includes a higher number over a lower number (example: 110/72). HOME CARE   Make lifestyle changes as told by your doctor. This may include weight loss and exercise.  Take your blood pressure medicine every day.  Limit how much salt you use.  Stop smoking if you smoke.  Do not use drugs.  Talk to your doctor if you are using decongestants or birth control pills. These medicines might make blood pressure higher.  Females should not drink more than 1 alcoholic drink per day. Males should not drink more than 2 alcoholic drinks per day.  See your doctor as told. GET HELP RIGHT AWAY IF:   You have a blood pressure reading with a top number of 180 or higher.  You get a very bad headache.  You get blurred or changing vision.  You feel confused.  You feel weak, numb, or faint.  You get chest or belly (abdominal) pain.  You throw up (vomit).  You cannot breathe very well. MAKE SURE YOU:   Understand these instructions.  Will watch your condition.  Will get help right away if you are not doing well or get worse. Document Released: 09/14/2007 Document Revised: 06/20/2011 Document Reviewed: 09/14/2007 [facility] Patient Information 2014 [place], [place].

## 2013-08-16 LAB — COMPREHENSIVE METABOLIC PANEL WITH GFR
BUN: 8 mg/dL (ref 6–23)
CO2: 24 meq/L (ref 19–32)
Calcium: 9.9 mg/dL (ref 8.4–10.5)
Chloride: 107 meq/L (ref 96–112)
Creat: 0.52 mg/dL (ref 0.50–1.10)
Glucose, Bld: 80 mg/dL (ref 70–99)
Total Bilirubin: 0.3 mg/dL (ref 0.2–1.2)

## 2013-08-16 LAB — COMPREHENSIVE METABOLIC PANEL
ALT: 11 U/L (ref 0–35)
AST: 15 U/L (ref 0–37)
Albumin: 4.1 g/dL (ref 3.5–5.2)
Alkaline Phosphatase: 56 U/L (ref 39–117)
Potassium: 4 mEq/L (ref 3.5–5.3)
Sodium: 138 mEq/L (ref 135–145)
Total Protein: 7.3 g/dL (ref 6.0–8.3)

## 2013-08-19 ENCOUNTER — Telehealth: Payer: Self-pay

## 2013-08-19 MED ORDER — PROMETHAZINE-DM 6.25-15 MG/5ML PO SYRP: 5.0000 mL | ORAL_SOLUTION | Freq: Four times a day (QID) | ORAL | Status: DC | PRN

## 2013-08-19 NOTE — Telephone Encounter (Signed)
Spoke to [employer].  Advised her that new cough med was sent to pharmacy.  Stop taking the HYCODAN syrup.  If sx do not improve RTC.  Patient verbalized understanding.

## 2013-08-19 NOTE — Telephone Encounter (Signed)
Pt states cough meds not working,please advise   Best phone for pt is [place] [address]

## 2013-08-19 NOTE — Telephone Encounter (Signed)
I have sent a different cough medicine to her pharmacy.  DO NOT take the Hycodan with the new cough medicine (phenergan/dm).  RTC if not improving

## 2013-08-22 ENCOUNTER — Encounter: Payer: Self-pay | Admitting: Family Medicine

## 2013-08-30 ENCOUNTER — Ambulatory Visit (INDEPENDENT_AMBULATORY_CARE_PROVIDER_SITE_OTHER): Payer: PRIVATE HEALTH INSURANCE | Admitting: Family Medicine

## 2013-08-30 VITALS — BP 110/70 | HR 72 | Temp 98.0°F | Resp 16 | Ht 63.0 in | Wt 124.0 lb

## 2013-08-30 DIAGNOSIS — R059 Cough, unspecified: Secondary | ICD-10-CM

## 2013-08-30 DIAGNOSIS — L309 Dermatitis, unspecified: Secondary | ICD-10-CM

## 2013-08-30 DIAGNOSIS — R05 Cough: Secondary | ICD-10-CM

## 2013-08-30 MED ORDER — PREDNISONE 20 MG PO TABS: ORAL_TABLET | ORAL | Status: DC

## 2013-08-30 MED ORDER — HYDROCOD POLST-CHLORPHEN POLST 10-8 MG/5ML PO LQCR: 5.0000 mL | Freq: Every evening | ORAL | Status: DC | PRN

## 2013-08-30 NOTE — Patient Instructions (Signed)
No more Augmentin Stop hycodan Stop Lisinopril Stop guaiafenesin (mucinex) Stop tessalon Stop claritin (loratidine)

## 2013-08-30 NOTE — Progress Notes (Signed)
This chart was scribed for Carrie Hamman, MD by Eston Mould, ED Scribe. This patient was seen in room Room/bed 3 and the patient's care was started at 10:08 AM. Subjective:    Patient ID: Carrie Carpenter, female    DOB: Oct 23, 1965, 48 y.o.   MRN: [id number]  Rash Associated symptoms include coughing (persistent) and vomiting (x 1).  Carrie Carpenter is a 48 y.o. female who presents to the [facility] for recheck about from last weeks visit of persistent cough. Pt states she is still having her persistent cough and reports not being able to sleep due to the cough. She reports having an episode of emesis last night due to productive cough. She states the Hycodan rx has not helped with her cough at all. She denies having a hx of persistent cough or smoking. Pt states she is having sinus pressure. Pt denies any recent fevers, chills and emesis.  She is employed with a [employer]. She denies working in a dusty environment.   Review of Systems  Respiratory: Positive for cough (persistent) and wheezing.   Gastrointestinal: Positive for vomiting (x 1).  Skin: Positive for rash.  All other systems reviewed and are negative.  Objective:   Physical Exam  Nursing note and vitals reviewed. Constitutional: She is oriented to person, place, and time. She appears well-developed and well-nourished. No distress.  HENT:  Head: Normocephalic and atraumatic.  Mouth/Throat: Oropharynx is clear and moist.  Eyes: EOM are normal.  Neck: Normal range of motion. Neck supple. No tracheal deviation present.  Cardiovascular: Normal rate.   Pulmonary/Chest: Effort normal. No respiratory distress. She has wheezes.  Expiratory wheezes.  Musculoskeletal: Normal range of motion.  Neurological: She is alert and oriented to person, place, and time.  Skin: Skin is warm and dry. Rash noted.  Patient notes an itchy erythematous mildly papular rash over left elbow which she had last visit and it has improved since that time.   Psychiatric: She has a normal mood and affect. Her behavior is normal.   Current outpatient prescriptions:benzonatate (TESSALON) 100 MG capsule, Take 2 capsules (200 mg total) by mouth 2 (two) times daily as needed for cough., Disp: 30 capsule, Rfl: 1;  diazepam (VALIUM) 5 MG tablet, Take 1 tablet (5 mg total) by mouth 2 (two) times daily., Disp: 10 tablet, Rfl: 0;  HYDROcodone-homatropine (HYCODAN) 5-1.5 MG/5ML syrup, Take 5 mLs by mouth every 6 (six) hours as needed for cough., Disp: 180 mL, Rfl: 0 lisinopril (PRINIVIL,ZESTRIL) 5 MG tablet, Take 1 tablet (5 mg total) by mouth daily., Disp: 90 tablet, Rfl: 3;  loratadine (CLARITIN) 10 MG tablet, Take 1 tablet (10 mg total) by mouth daily., Disp: 20 tablet, Rfl: 0;  montelukast (SINGULAIR) 10 MG tablet, Take 1 tablet (10 mg total) by mouth at bedtime., Disp: 30 tablet, Rfl: 3 promethazine-dextromethorphan (PROMETHAZINE-DM) 6.25-15 MG/5ML syrup, Take 5 mLs by mouth 4 (four) times daily as needed for cough., Disp: 118 mL, Rfl: 0  Triage Vitals:BP 110/70   Pulse 72   Temp(Src) 98 F (36.7 C) (Oral)   Resp 16   Ht 5\' 3"  (1.6 m)   Wt 124 lb (56.246 kg)   BMI 21.97 kg/m2   SpO2 100%   LMP 07/19/2013 Assessment & Plan:  Informed pt to stop taking Lysinopril, Mucinex, Augmentin, Hycodan, Zyrtec, and Claritin. Will discharge pt with a new medication.  Cough - Plan: predniSONE (DELTASONE) 20 MG tablet, chlorpheniramine-HYDROcodone (TUSSIONEX PENNKINETIC ER) 10-8 MG/5ML LQCR  Eczema  Signed, Robyn Haber,  MD

## 2013-09-30 ENCOUNTER — Ambulatory Visit (INDEPENDENT_AMBULATORY_CARE_PROVIDER_SITE_OTHER): Payer: PRIVATE HEALTH INSURANCE | Admitting: Family Medicine

## 2013-09-30 VITALS — BP 118/82 | HR 74 | Temp 98.8°F | Resp 14 | Ht 62.0 in | Wt 124.8 lb

## 2013-09-30 DIAGNOSIS — R05 Cough: Secondary | ICD-10-CM

## 2013-09-30 DIAGNOSIS — R059 Cough, unspecified: Secondary | ICD-10-CM

## 2013-09-30 LAB — POCT CBC
Granulocyte percent: 40.5 %G (ref 37–80)
HCT, POC: 34.4 % — AB (ref 37.7–47.9)
Hemoglobin: 10.5 g/dL — AB (ref 12.2–16.2)
LYMPH, POC: 2.1 (ref 0.6–3.4)
MCH: 20.8 pg — AB (ref 27–31.2)
MCHC: 30.5 g/dL — AB (ref 31.8–35.4)
MCV: 68.3 fL — AB (ref 80–97)
MID (CBC): 0.4 (ref 0–0.9)
MPV: 9.7 fL (ref 0–99.8)
PLATELET COUNT, POC: 334 10*3/uL (ref 142–424)
POC Granulocyte: 1.7 — AB (ref 2–6.9)
POC LYMPH %: 50.7 % — AB (ref 10–50)
POC MID %: 8.8 % (ref 0–12)
RBC: 5.04 M/uL (ref 4.04–5.48)
RDW, POC: 19.6 %
WBC: 4.2 10*3/uL — AB (ref 4.6–10.2)

## 2013-09-30 MED ORDER — ALBUTEROL SULFATE HFA 108 (90 BASE) MCG/ACT IN AERS: 2.0000 | INHALATION_SPRAY | RESPIRATORY_TRACT | Status: DC | PRN

## 2013-09-30 NOTE — Patient Instructions (Signed)
Take diphenhydramine- 1 tablet at bedtime  Cough, Adult  A cough is a reflex that helps clear your throat and airways. It can help heal the body or may be a reaction to an irritated airway. A cough may only last 2 or 3 weeks (acute) or may last more than 8 weeks (chronic).  CAUSES Acute cough:  Viral or bacterial infections. Chronic cough:  Infections.  Allergies.  Asthma.  Post-nasal drip.  Smoking.  Heartburn or acid reflux.  Some medicines.  Chronic lung problems (COPD).  Cancer. SYMPTOMS   Cough.  Fever.  Chest pain.  Increased breathing rate.  High-pitched whistling sound when breathing (wheezing).  Colored mucus that you cough up (sputum). TREATMENT   A bacterial cough may be treated with antibiotic medicine.  A viral cough must run its course and will not respond to antibiotics.  Your caregiver may recommend other treatments if you have a chronic cough. HOME CARE INSTRUCTIONS   Only take over-the-counter or prescription medicines for pain, discomfort, or fever as directed by your caregiver. Use cough suppressants only as directed by your caregiver.  Use a cold steam vaporizer or humidifier in your bedroom or home to help loosen secretions.  Sleep in a semi-upright position if your cough is worse at night.  Rest as needed.  Stop smoking if you smoke. SEEK IMMEDIATE MEDICAL CARE IF:   You have pus in your sputum.  Your cough starts to worsen.  You cannot control your cough with suppressants and are losing sleep.  You begin coughing up blood.  You have difficulty breathing.  You develop pain which is getting worse or is uncontrolled with medicine.  You have a fever. MAKE SURE YOU:   Understand these instructions.  Will watch your condition.  Will get help right away if you are not doing well or get worse. Document Released: 09/24/2010 Document Revised: 06/20/2011 Document Reviewed: 09/24/2010 [facility] Patient Information 2015  [place], [place]. This information is not intended to replace advice given to you by your health care provider. Make sure you discuss any questions you have with your health care provider.

## 2013-09-30 NOTE — Progress Notes (Signed)
   Subjective:    Patient ID: Carrie Carpenter, female    DOB: 06/17/1965, 48 y.o.   MRN: [id number]  HPI The patient is a pleasant 48 yo female who is accompanied by her husband. Patient presents today with cough since the beginning of May. She was seen here twice in May and prescribed and completed a course of Augmentin . She had a normal CXR  08/11/13 when she was seen in the ER for migraine/URI. She has been prescribed various agents for cough, none of which provided significant relief.   Her cough is worse through the night and in the morning. Sometimes has large amount clear, watery sputum. Occasionally throws up a little with cough. Does not blow nose frequently, but feels like she has a lot of drainage down the back of her throat. Is not currently taking any medications- prescription or OTC.  She reports that her symptoms have not gotten worse, just that they are persistent.   Review of Systems No fever/chills, upper throat hurts, no chest pain, no SOB, occasional wheeze during coughing spells, energy level ok. No reflux symptoms. No ear pain. No frequent sneezing, no itchy eyes. No abdominal pain.     Objective:   Physical Exam  Vitals reviewed. Constitutional: She is oriented to person, place, and time. She appears well-developed and well-nourished. No distress.  HENT:  Head: Normocephalic and atraumatic.  Right Ear: Tympanic membrane, external ear and ear canal normal.  Left Ear: Tympanic membrane, external ear and ear canal normal.  Nose: Mucosal edema present. Right sinus exhibits no maxillary sinus tenderness and no frontal sinus tenderness. Left sinus exhibits no maxillary sinus tenderness and no frontal sinus tenderness.  Mouth/Throat: Uvula is midline and mucous membranes are normal. Posterior oropharyngeal edema present. Oropharyngeal exudate: Nares congested, small amount pharyngeal erythema, small amount post nasal drainage.  Eyes: Conjunctivae are normal. Right eye exhibits no  discharge. Left eye exhibits no discharge. No scleral icterus.  Neck: Normal range of motion. Neck supple.  Cardiovascular: Normal rate, regular rhythm and normal heart sounds.   Pulmonary/Chest: Effort normal and breath sounds normal.  Neurological: She is alert and oriented to person, place, and time.  Skin: Skin is warm and dry. She is not diaphoretic.  Psychiatric: She has a normal mood and affect. Her behavior is normal. Judgment and thought content normal.      Assessment & Plan:  1. Cough - POCT CBC - albuterol (PROVENTIL HFA;VENTOLIN HFA) 108 (90 BASE) MCG/ACT inhaler; Inhale 2 puffs into the lungs every 4 (four) hours as needed for wheezing or shortness of breath (cough, shortness of breath or wheezing.).  Dispense: 1 Inhaler; Refill: 1 -Discussed symptoms with patient. Stable, no s/s infectious process. Questionable reactive airway with post nasal drainage. -Provided written and verbal instructions- Instructed her on use of inhaler, add benadryl at bedtime to dry secretions and/or long acting, non-sedating antihistamine. -Instructed patient to let us know if she is not improved in 2 weeks and we will refer to pulmonary.  Elby Beck, FNP-BC  [facility]  09/30/2013 1:11 PM

## 2013-11-25 ENCOUNTER — Other Ambulatory Visit: Payer: Self-pay | Admitting: Family Medicine

## 2014-02-14 ENCOUNTER — Ambulatory Visit (INDEPENDENT_AMBULATORY_CARE_PROVIDER_SITE_OTHER): Payer: PRIVATE HEALTH INSURANCE | Admitting: Family Medicine

## 2014-02-14 VITALS — BP 130/86 | HR 80 | Temp 97.8°F | Resp 16 | Ht 61.0 in | Wt 125.2 lb

## 2014-02-14 DIAGNOSIS — M62838 Other muscle spasm: Secondary | ICD-10-CM

## 2014-02-14 DIAGNOSIS — G43909 Migraine, unspecified, not intractable, without status migrainosus: Secondary | ICD-10-CM

## 2014-02-14 DIAGNOSIS — R112 Nausea with vomiting, unspecified: Secondary | ICD-10-CM

## 2014-02-14 DIAGNOSIS — M6248 Contracture of muscle, other site: Secondary | ICD-10-CM

## 2014-02-14 MED ORDER — ONDANSETRON 4 MG PO TBDP
4.0000 mg | ORAL_TABLET | Freq: Once | ORAL | Status: AC
  Administered 2014-02-14: 4 mg via ORAL

## 2014-02-14 MED ORDER — KETOROLAC TROMETHAMINE 60 MG/2ML IM SOLN
60.0000 mg | Freq: Once | INTRAMUSCULAR | Status: AC
  Administered 2014-02-14: 60 mg via INTRAMUSCULAR

## 2014-02-14 MED ORDER — ONDANSETRON 4 MG PO TBDP: 4.0000 mg | ORAL_TABLET | Freq: Three times a day (TID) | ORAL | Status: DC | PRN

## 2014-02-14 MED ORDER — CYCLOBENZAPRINE HCL 5 MG PO TABS: ORAL_TABLET | ORAL | Status: DC

## 2014-02-14 NOTE — Patient Instructions (Signed)
Your headache appears to be from a migraine, and muscle spasms of neck are sometimes tension type headaches. Toradol injection (antiinflammatory pain medicine) was given in office, and antinausea medicine - Zofran.  At home can take flexeril for muscle spasms, heat or ice to back of neck, rest and out of work Midwife. If nausea returns - I also sent in zofran for nausea.  If any worsening of headache, neck stiffness or fever - go to an emergency room.  We will refer you to neurologist as discussed for your headaches.  Return to the clinic or go to the nearest emergency room if any of your symptoms worsen or new symptoms occur.  Headaches, Frequently Asked Questions MIGRAINE HEADACHES Q: What is migraine? What causes it? How can I treat it? A: Generally, migraine headaches begin as a dull ache. Then they develop into a constant, throbbing, and pulsating pain. You may experience pain at the temples. You may experience pain at the front or back of one or both sides of the head. The pain is usually accompanied by a combination of:  Nausea.  Vomiting.  Sensitivity to light and noise. Some people (about 15%) experience an aura (see below) before an attack. The cause of migraine is believed to be chemical reactions in the brain. Treatment for migraine may include over-the-counter or prescription medications. It may also include self-help techniques. These include relaxation training and biofeedback.  Q: What is an aura? A: About 15% of people with migraine get an "aura". This is a sign of neurological symptoms that occur before a migraine headache. You may see wavy or jagged lines, dots, or flashing lights. You might experience tunnel vision or blind spots in one or both eyes. The aura can include visual or auditory hallucinations (something imagined). It may include disruptions in smell (such as strange odors), taste or touch. Other symptoms include:  Numbness.  A "pins and needles"  sensation.  Difficulty in recalling or speaking the correct word. These neurological events may last as long as 60 minutes. These symptoms will fade as the headache begins. Q: What is a trigger? A: Certain physical or environmental factors can lead to or "trigger" a migraine. These include:  Foods.  Hormonal changes.  Weather.  Stress. It is important to remember that triggers are different for everyone. To help prevent migraine attacks, you need to figure out which triggers affect you. Keep a headache diary. This is a good way to track triggers. The diary will help you talk to your healthcare professional about your condition. Q: Does weather affect migraines? A: Bright sunshine, hot, humid conditions, and drastic changes in barometric pressure may lead to, or "trigger," a migraine attack in some people. But studies have shown that weather does not act as a trigger for everyone with migraines. Q: What is the link between migraine and hormones? A: Hormones start and regulate many of your body's functions. Hormones keep your body in balance within a constantly changing environment. The levels of hormones in your body are unbalanced at times. Examples are during menstruation, pregnancy, or menopause. That can lead to a migraine attack. In fact, about three quarters of all women with migraine report that their attacks are related to the menstrual cycle.  Q: Is there an increased risk of stroke for migraine sufferers? A: The likelihood of a migraine attack causing a stroke is very remote. That is not to say that migraine sufferers cannot have a stroke associated with their migraines. In persons under age  40, the most common associated factor for stroke is migraine headache. But over the course of a person's normal life span, the occurrence of migraine headache may actually be associated with a reduced risk of dying from cerebrovascular disease due to stroke.  Q: What are acute medications for  migraine? A: Acute medications are used to treat the pain of the headache after it has started. Examples over-the-counter medications, NSAIDs, ergots, and triptans.  Q: What are the triptans? A: Triptans are the newest class of abortive medications. They are specifically targeted to treat migraine. Triptans are vasoconstrictors. They moderate some chemical reactions in the brain. The triptans work on receptors in your brain. Triptans help to restore the balance of a neurotransmitter called serotonin. Fluctuations in levels of serotonin are thought to be a main cause of migraine.  Q: Are over-the-counter medications for migraine effective? A: Over-the-counter, or "OTC," medications may be effective in relieving mild to moderate pain and associated symptoms of migraine. But you should see your caregiver before beginning any treatment regimen for migraine.  Q: What are preventive medications for migraine? A: Preventive medications for migraine are sometimes referred to as "prophylactic" treatments. They are used to reduce the frequency, severity, and length of migraine attacks. Examples of preventive medications include antiepileptic medications, antidepressants, beta-blockers, calcium channel blockers, and NSAIDs (nonsteroidal anti-inflammatory drugs). Q: Why are anticonvulsants used to treat migraine? A: During the past few years, there has been an increased interest in antiepileptic drugs for the prevention of migraine. They are sometimes referred to as "anticonvulsants". Both epilepsy and migraine may be caused by similar reactions in the brain.  Q: Why are antidepressants used to treat migraine? A: Antidepressants are typically used to treat people with depression. They may reduce migraine frequency by regulating chemical levels, such as serotonin, in the brain.  Q: What alternative therapies are used to treat migraine? A: The term "alternative therapies" is often used to describe treatments  considered outside the scope of conventional Western medicine. Examples of alternative therapy include acupuncture, acupressure, and yoga. Another common alternative treatment is herbal therapy. Some herbs are believed to relieve headache pain. Always discuss alternative therapies with your caregiver before proceeding. Some herbal products contain arsenic and other toxins. TENSION HEADACHES Q: What is a tension-type headache? What causes it? How can I treat it? A: Tension-type headaches occur randomly. They are often the result of temporary stress, anxiety, fatigue, or anger. Symptoms include soreness in your temples, a tightening band-like sensation around your head (a "vice-like" ache). Symptoms can also include a pulling feeling, pressure sensations, and contracting head and neck muscles. The headache begins in your forehead, temples, or the back of your head and neck. Treatment for tension-type headache may include over-the-counter or prescription medications. Treatment may also include self-help techniques such as relaxation training and biofeedback. CLUSTER HEADACHES Q: What is a cluster headache? What causes it? How can I treat it? A: Cluster headache gets its name because the attacks come in groups. The pain arrives with little, if any, warning. It is usually on one side of the head. A tearing or bloodshot eye and a runny nose on the same side of the headache may also accompany the pain. Cluster headaches are believed to be caused by chemical reactions in the brain. They have been described as the most severe and intense of any headache type. Treatment for cluster headache includes prescription medication and oxygen. SINUS HEADACHES Q: What is a sinus headache? What causes it? How can I  treat it? A: When a cavity in the bones of the face and skull (a sinus) becomes inflamed, the inflammation will cause localized pain. This condition is usually the result of an allergic reaction, a tumor, or an  infection. If your headache is caused by a sinus blockage, such as an infection, you will probably have a fever. An x-ray will confirm a sinus blockage. Your caregiver's treatment might include antibiotics for the infection, as well as antihistamines or decongestants.  REBOUND HEADACHES Q: What is a rebound headache? What causes it? How can I treat it? A: A pattern of taking acute headache medications too often can lead to a condition known as "rebound headache." A pattern of taking too much headache medication includes taking it more than 2 days per week or in excessive amounts. That means more than the label or a caregiver advises. With rebound headaches, your medications not only stop relieving pain, they actually begin to cause headaches. Doctors treat rebound headache by tapering the medication that is being overused. Sometimes your caregiver will gradually substitute a different type of treatment or medication. Stopping may be a challenge. Regularly overusing a medication increases the potential for serious side effects. Consult a caregiver if you regularly use headache medications more than 2 days per week or more than the label advises. ADDITIONAL QUESTIONS AND ANSWERS Q: What is biofeedback? A: Biofeedback is a self-help treatment. Biofeedback uses special equipment to monitor your body's involuntary physical responses. Biofeedback monitors:  Breathing.  Pulse.  Heart rate.  Temperature.  Muscle tension.  Brain activity. Biofeedback helps you refine and perfect your relaxation exercises. You learn to control the physical responses that are related to stress. Once the technique has been mastered, you do not need the equipment any more. Q: Are headaches hereditary? A: Four out of five (80%) of people that suffer report a family history of migraine. Scientists are not sure if this is genetic or a family predisposition. Despite the uncertainty, a child has a 50% chance of having migraine if  one parent suffers. The child has a 75% chance if both parents suffer.  Q: Can children get headaches? A: By the time they reach high school, most young people have experienced some type of headache. Many safe and effective approaches or medications can prevent a headache from occurring or stop it after it has begun.  Q: What type of doctor should I see to diagnose and treat my headache? A: Start with your primary caregiver. Discuss his or her experience and approach to headaches. Discuss methods of classification, diagnosis, and treatment. Your caregiver may decide to recommend you to a headache specialist, depending upon your symptoms or other physical conditions. Having diabetes, allergies, etc., may require a more comprehensive and inclusive approach to your headache. The [employer] will provide, upon request, a list of [facility] physician members in your state. Document Released: 06/18/2003 Document Revised: 06/20/2011 Document Reviewed: 11/26/2007 [facility] Patient Information 2015 [place], [place]. This information is not intended to replace advice given to you by your health care provider. Make sure you discuss any questions you have with your health care provider.

## 2014-02-14 NOTE — Progress Notes (Signed)
Subjective:    Patient ID: Carrie Carpenter, female    DOB: 1965-12-28, 48 y.o.   MRN: [id number]   Chief Complaint  Patient presents with  . Headache    front and back thru to the neck since last night--vomiting--Patient states she wants a shot to help with headache   Patient Active Problem List   Diagnosis Date Noted  . HTN (hypertension) 03/13/2012  . Insomnia 03/13/2012   Past Medical History  Diagnosis Date  . Hypertension   . Allergy    Past Surgical History  Procedure Laterality Date  . Tubal ligation     No Known Allergies Prior to Admission medications   Medication Sig Start Date End Date Taking? Authorizing Provider  PROVENTIL HFA 108 (90 BASE) MCG/ACT inhaler INHALE 2 PUFFS INTOTHE LUNGS EVERY 4 HOURS AS NEEDED FOR WHEEZE OR FOR SHORTNESS OF BREATH 11/25/13   Elby Beck, FNP   History   Social History  . Marital Status: Married    Spouse Name: Narom    Number of Children: 3  . Years of Education: 5   Occupational History  . 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 Smoking status: Never Smoker   . Smokeless tobacco: Never Used  . Alcohol Use: No  . Drug Use: No  . Sexual Activity: Not on file   Other Topics Concern  . Not on file   Social History Narrative   Lives with husband and 3 children. From [place]; in the [place] since 1989.    HPI  Carrie Carpenter is a 48 y.o. female  PCP: No PCP Per Patient  HPI Comments:   Patient reports headache and neck pain, beginning last night, with associated vomiting (1x) this morning. 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 She reports prior experience with these symptoms - 7 or 8 months ago, she was in the ED at [facility] for the same symptoms (May 2015), and at [facility] back in 2010. She attempted to manage her pain at home with Advil; this did not relieve her  pain. She is unsure of a history with migraines. She denies fevers, photophobia or phonophobia, coughing, sick contacts.   Daughter explains that these symptoms have been ongoing for several years; she was seen by a neurologist but is unsure what medication she may have received. She states that there are no differences between this headache and the patient's prior headaches. She has taken muscle relaxants before with some relief.   Review of Systems  Constitutional: Negative for fever.  Eyes: Negative for photophobia.  Gastrointestinal: Positive for nausea and vomiting.  Musculoskeletal: Positive for neck pain.  Neurological: Positive for headaches.  All other systems reviewed and are negative.      Objective:   Physical Exam  Constitutional: She is oriented to person, place, and time. She appears well-developed and well-nourished. No distress.  HENT:  Head: Normocephalic and atraumatic.  Right Ear: Hearing, tympanic membrane, external ear and ear canal normal.  Left Ear: Hearing, tympanic membrane, external ear and ear canal normal.  Nose: Nose normal.  Mouth/Throat: Oropharynx is clear and moist. No oropharyngeal exudate.  Moist oral mucosa; TMs are pearly gray  Eyes: Conjunctivae and EOM are normal. Pupils are equal, round, and reactive to light.  Neck: Normal range of motion. Neck supple.  No tracheal deviation present.  Tenderness along the paraspinal muscles  Cardiovascular: Normal rate, regular rhythm, normal heart sounds and intact distal pulses.   No murmur heard. Pulmonary/Chest: Effort normal and breath sounds normal. No respiratory distress. She has no wheezes. She has no rhonchi.  Neurological: She is alert and oriented to person, place, and time.  Negative Rhomerg, no pronator drift, normal heel to toe, nonfocal neuro exam   Skin: Skin is warm and dry. No rash noted.  Psychiatric: She has a normal mood and affect. Her behavior is normal.  Nursing note and vitals  reviewed.   Filed Vitals:   02/14/14 1419  BP: 130/86  Pulse: 80  Temp: 97.8 F (36.6 C)  TempSrc: Oral  Resp: 16  Height: 5\' 1"  (1.549 m)  Weight: 125 lb 3.2 oz (56.79 kg)  SpO2: 97%   Daughter present for end of exam and translated history.     Assessment & Plan:   Carrie Carpenter is a 48 y.o. female Migraine without status migrainosus, not intractable, unspecified migraine type - Plan: Ambulatory referral to Neurology, ketorolac (TORADOL) injection 60 mg, ondansetron (ZOFRAN-ODT) disintegrating tablet 4 mg  Muscle spasms of neck - Plan: ketorolac (TORADOL) injection 60 mg, ondansetron (ZOFRAN-ODT) disintegrating tablet 4 mg  Non-intractable vomiting with nausea, vomiting of unspecified type - Plan: ketorolac (TORADOL) injection 60 mg, ondansetron (ZOFRAN-ODT) disintegrating tablet 4 mg  Suspected migraine HA with mm spasms in neck, but not rigid, afebrile and nonfocal neuro exam. Similar HA as in past - suspected migraine.   -Toradol 60mg  IM, Xofran 4mg  ODT  -flexeril at home and zofran if needed.   -refer to neuro for further HA eval - if migraines, may benefit from abortive such as Imitrex.   -ER/911 precautions given for overnight. Understanding expressed with use of interpreter.   Meds ordered this encounter  Medications  . ondansetron (ZOFRAN ODT) 4 MG disintegrating tablet    Sig: Take 1 tablet (4 mg total) by mouth every 8 (eight) hours as needed for nausea or vomiting.    Dispense:  10 tablet    Refill:  0  . cyclobenzaprine (FLEXERIL) 5 MG tablet    Sig: 1 pill by mouth up to every 8 hours as needed. Start with one pill by mouth each bedtime as needed due to sedation    Dispense:  15 tablet    Refill:  0  . ketorolac (TORADOL) injection 60 mg    Sig:   . ondansetron (ZOFRAN-ODT) disintegrating tablet 4 mg    Sig:    Patient Instructions  Your headache appears to be from a migraine, and muscle spasms of neck are sometimes tension type headaches. Toradol injection  (antiinflammatory pain medicine) was given in office, and antinausea medicine - Zofran.  At home can take flexeril for muscle spasms, heat or ice to back of neck, rest and out of work Midwife. If nausea returns - I also sent in zofran for nausea.  If any worsening of headache, neck stiffness or fever - go to an emergency room.  We will refer you to neurologist as discussed for your headaches.  Return to the clinic or go to the nearest emergency room if any of your symptoms worsen or new symptoms occur.  Headaches, Frequently Asked Questions MIGRAINE HEADACHES Q: What is migraine? What causes it? How can I treat it? A: Generally, migraine headaches begin as a dull ache. Then they develop into a constant, throbbing, and pulsating pain. You may experience pain  at the temples. You may experience pain at the front or back of one or both sides of the head. The pain is usually accompanied by a combination of:  Nausea.  Vomiting.  Sensitivity to light and noise. Some people (about 15%) experience an aura (see below) before an attack. The cause of migraine is believed to be chemical reactions in the brain. Treatment for migraine may include over-the-counter or prescription medications. It may also include self-help techniques. These include relaxation training and biofeedback.  Q: What is an aura? A: About 15% of people with migraine get an "aura". This is a sign of neurological symptoms that occur before a migraine headache. You may see wavy or jagged lines, dots, or flashing lights. You might experience tunnel vision or blind spots in one or both eyes. The aura can include visual or auditory hallucinations (something imagined). It may include disruptions in smell (such as strange odors), taste or touch. Other symptoms include:  Numbness.  A "pins and needles" sensation.  Difficulty in recalling or speaking the correct word. These neurological events may last as long as 60 minutes. These symptoms  will fade as the headache begins. Q: What is a trigger? A: Certain physical or environmental factors can lead to or "trigger" a migraine. These include:  Foods.  Hormonal changes.  Weather.  Stress. It is important to remember that triggers are different for everyone. To help prevent migraine attacks, you need to figure out which triggers affect you. Keep a headache diary. This is a good way to track triggers. The diary will help you talk to your healthcare professional about your condition. Q: Does weather affect migraines? A: Bright sunshine, hot, humid conditions, and drastic changes in barometric pressure may lead to, or "trigger," a migraine attack in some people. But studies have shown that weather does not act as a trigger for everyone with migraines. Q: What is the link between migraine and hormones? A: Hormones start and regulate many of your body's functions. Hormones keep your body in balance within a constantly changing environment. The levels of hormones in your body are unbalanced at times. Examples are during menstruation, pregnancy, or menopause. That can lead to a migraine attack. In fact, about three quarters of all women with migraine report that their attacks are related to the menstrual cycle.  Q: Is there an increased risk of stroke for migraine sufferers? A: The likelihood of a migraine attack causing a stroke is very remote. That is not to say that migraine sufferers cannot have a stroke associated with their migraines. In persons under age 77, the most common associated factor for stroke is migraine headache. But over the course of a person's normal life span, the occurrence of migraine headache may actually be associated with a reduced risk of dying from cerebrovascular disease due to stroke.  Q: What are acute medications for migraine? A: Acute medications are used to treat the pain of the headache after it has started. Examples over-the-counter medications, NSAIDs,  ergots, and triptans.  Q: What are the triptans? A: Triptans are the newest class of abortive medications. They are specifically targeted to treat migraine. Triptans are vasoconstrictors. They moderate some chemical reactions in the brain. The triptans work on receptors in your brain. Triptans help to restore the balance of a neurotransmitter called serotonin. Fluctuations in levels of serotonin are thought to be a main cause of migraine.  Q: Are over-the-counter medications for migraine effective? A: Over-the-counter, or "OTC," medications may be effective in  relieving mild to moderate pain and associated symptoms of migraine. But you should see your caregiver before beginning any treatment regimen for migraine.  Q: What are preventive medications for migraine? A: Preventive medications for migraine are sometimes referred to as "prophylactic" treatments. They are used to reduce the frequency, severity, and length of migraine attacks. Examples of preventive medications include antiepileptic medications, antidepressants, beta-blockers, calcium channel blockers, and NSAIDs (nonsteroidal anti-inflammatory drugs). Q: Why are anticonvulsants used to treat migraine? A: During the past few years, there has been an increased interest in antiepileptic drugs for the prevention of migraine. They are sometimes referred to as "anticonvulsants". Both epilepsy and migraine may be caused by similar reactions in the brain.  Q: Why are antidepressants used to treat migraine? A: Antidepressants are typically used to treat people with depression. They may reduce migraine frequency by regulating chemical levels, such as serotonin, in the brain.  Q: What alternative therapies are used to treat migraine? A: The term "alternative therapies" is often used to describe treatments considered outside the scope of conventional Western medicine. Examples of alternative therapy include acupuncture, acupressure, and yoga. Another common  alternative treatment is herbal therapy. Some herbs are believed to relieve headache pain. Always discuss alternative therapies with your caregiver before proceeding. Some herbal products contain arsenic and other toxins. TENSION HEADACHES Q: What is a tension-type headache? What causes it? How can I treat it? A: Tension-type headaches occur randomly. They are often the result of temporary stress, anxiety, fatigue, or anger. Symptoms include soreness in your temples, a tightening band-like sensation around your head (a "vice-like" ache). Symptoms can also include a pulling feeling, pressure sensations, and contracting head and neck muscles. The headache begins in your forehead, temples, or the back of your head and neck. Treatment for tension-type headache may include over-the-counter or prescription medications. Treatment may also include self-help techniques such as relaxation training and biofeedback. CLUSTER HEADACHES Q: What is a cluster headache? What causes it? How can I treat it? A: Cluster headache gets its name because the attacks come in groups. The pain arrives with little, if any, warning. It is usually on one side of the head. A tearing or bloodshot eye and a runny nose on the same side of the headache may also accompany the pain. Cluster headaches are believed to be caused by chemical reactions in the brain. They have been described as the most severe and intense of any headache type. Treatment for cluster headache includes prescription medication and oxygen. SINUS HEADACHES Q: What is a sinus headache? What causes it? How can I treat it? A: When a cavity in the bones of the face and skull (a sinus) becomes inflamed, the inflammation will cause localized pain. This condition is usually the result of an allergic reaction, a tumor, or an infection. If your headache is caused by a sinus blockage, such as an infection, you will probably have a fever. An x-ray will confirm a sinus blockage. Your  caregiver's treatment might include antibiotics for the infection, as well as antihistamines or decongestants.  REBOUND HEADACHES Q: What is a rebound headache? What causes it? How can I treat it? A: A pattern of taking acute headache medications too often can lead to a condition known as "rebound headache." A pattern of taking too much headache medication includes taking it more than 2 days per week or in excessive amounts. That means more than the label or a caregiver advises. With rebound headaches, your medications not only stop relieving  pain, they actually begin to cause headaches. Doctors treat rebound headache by tapering the medication that is being overused. Sometimes your caregiver will gradually substitute a different type of treatment or medication. Stopping may be a challenge. Regularly overusing a medication increases the potential for serious side effects. Consult a caregiver if you regularly use headache medications more than 2 days per week or more than the label advises. ADDITIONAL QUESTIONS AND ANSWERS Q: What is biofeedback? A: Biofeedback is a self-help treatment. Biofeedback uses special equipment to monitor your body's involuntary physical responses. Biofeedback monitors:  Breathing.  Pulse.  Heart rate.  Temperature.  Muscle tension.  Brain activity. Biofeedback helps you refine and perfect your relaxation exercises. You learn to control the physical responses that are related to stress. Once the technique has been mastered, you do not need the equipment any more. Q: Are headaches hereditary? A: Four out of five (80%) of people that suffer report a family history of migraine. Scientists are not sure if this is genetic or a family predisposition. Despite the uncertainty, a child has a 50% chance of having migraine if one parent suffers. The child has a 75% chance if both parents suffer.  Q: Can children get headaches? A: By the time they reach high school, most young  people have experienced some type of headache. Many safe and effective approaches or medications can prevent a headache from occurring or stop it after it has begun.  Q: What type of doctor should I see to diagnose and treat my headache? A: Start with your primary caregiver. Discuss his or her experience and approach to headaches. Discuss methods of classification, diagnosis, and treatment. Your caregiver may decide to recommend you to a headache specialist, depending upon your symptoms or other physical conditions. Having diabetes, allergies, etc., may require a more comprehensive and inclusive approach to your headache. The National Head[employer] Foundation will provide, upon request, a list of [facility] physician members in your state. Document Released: 06/18/2003 Document Revised: 06/20/2011 Document Reviewed: 11/26/2007 [facility] Patient Information 2015 [place], [place]. This information is not intended to replace advice given to you by your health care provider. Make sure you discuss any questions you have with your health care provider.     I personally performed the services described in this documentation, which was scribed in my presence. The recorded information has been reviewed and considered, and addended by me as needed.

## 2014-03-04 ENCOUNTER — Ambulatory Visit (INDEPENDENT_AMBULATORY_CARE_PROVIDER_SITE_OTHER): Payer: PRIVATE HEALTH INSURANCE | Admitting: Diagnostic Neuroimaging

## 2014-03-04 ENCOUNTER — Encounter: Payer: Self-pay | Admitting: Diagnostic Neuroimaging

## 2014-03-04 VITALS — BP 138/87 | HR 82 | Temp 97.9°F | Ht 62.0 in | Wt 126.6 lb

## 2014-03-04 DIAGNOSIS — G43019 Migraine without aura, intractable, without status migrainosus: Secondary | ICD-10-CM

## 2014-03-04 MED ORDER — AMITRIPTYLINE HCL 25 MG PO TABS: 25.0000 mg | ORAL_TABLET | Freq: Every day | ORAL | Status: DC

## 2014-03-04 MED ORDER — RIZATRIPTAN BENZOATE 10 MG PO TBDP: 10.0000 mg | ORAL_TABLET | ORAL | Status: DC | PRN

## 2014-03-04 NOTE — Patient Instructions (Signed)
Try amitriptyline 25mg  at bedtime.  Use rizatriptan as needed for migraine headaches.
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
## 2014-03-19 ENCOUNTER — Other Ambulatory Visit: Payer: PRIVATE HEALTH INSURANCE

## 2014-03-20 ENCOUNTER — Ambulatory Visit: Payer: Self-pay | Admitting: Family

## 2014-03-20 ENCOUNTER — Ambulatory Visit (INDEPENDENT_AMBULATORY_CARE_PROVIDER_SITE_OTHER): Payer: PRIVATE HEALTH INSURANCE

## 2014-03-20 DIAGNOSIS — G43019 Migraine without aura, intractable, without status migrainosus: Secondary | ICD-10-CM

## 2014-03-20 MED ORDER — GADOPENTETATE DIMEGLUMINE 469.01 MG/ML IV SOLN: 12.0000 mL | Freq: Once | INTRAVENOUS | Status: AC | PRN

## 2014-05-01 ENCOUNTER — Telehealth: Payer: Self-pay | Admitting: *Deleted

## 2014-05-01 NOTE — Telephone Encounter (Signed)
Spoke with patients daughter on the phone, explained that we needed to reschedule the appointment due to providers schedule. Spoke with Dr. Leta Baptist and he was ok with putting the patient in a 30 minute time slot due to shorter notice.

## 2014-05-09 ENCOUNTER — Ambulatory Visit: Payer: PRIVATE HEALTH INSURANCE | Admitting: Diagnostic Neuroimaging

## 2014-05-12 ENCOUNTER — Ambulatory Visit: Payer: PRIVATE HEALTH INSURANCE | Admitting: Diagnostic Neuroimaging

## 2014-05-15 ENCOUNTER — Ambulatory Visit (INDEPENDENT_AMBULATORY_CARE_PROVIDER_SITE_OTHER): Payer: PRIVATE HEALTH INSURANCE | Admitting: Diagnostic Neuroimaging

## 2014-05-15 ENCOUNTER — Encounter: Payer: Self-pay | Admitting: Diagnostic Neuroimaging

## 2014-05-15 VITALS — BP 124/87 | HR 87 | Ht 62.0 in | Wt 129.4 lb

## 2014-05-15 DIAGNOSIS — G43019 Migraine without aura, intractable, without status migrainosus: Secondary | ICD-10-CM

## 2014-05-15 NOTE — Patient Instructions (Signed)
Continue current medications. 
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
## 2014-08-08 ENCOUNTER — Ambulatory Visit (INDEPENDENT_AMBULATORY_CARE_PROVIDER_SITE_OTHER): Payer: PRIVATE HEALTH INSURANCE

## 2014-08-08 ENCOUNTER — Encounter: Payer: Self-pay | Admitting: Physician Assistant

## 2014-08-08 ENCOUNTER — Ambulatory Visit (INDEPENDENT_AMBULATORY_CARE_PROVIDER_SITE_OTHER): Payer: PRIVATE HEALTH INSURANCE | Admitting: Emergency Medicine

## 2014-08-08 VITALS — BP 134/84 | HR 85 | Temp 98.3°F | Resp 16 | Ht 61.0 in | Wt 132.1 lb

## 2014-08-08 DIAGNOSIS — R140 Abdominal distension (gaseous): Secondary | ICD-10-CM

## 2014-08-08 DIAGNOSIS — R6881 Early satiety: Secondary | ICD-10-CM | POA: Diagnosis not present

## 2014-08-08 DIAGNOSIS — D649 Anemia, unspecified: Secondary | ICD-10-CM

## 2014-08-08 LAB — POCT URINE PREGNANCY: PREG TEST UR: NEGATIVE

## 2014-08-08 MED ORDER — POLYETHYLENE GLYCOL 3350 17 GM/SCOOP PO POWD: 17.0000 g | Freq: Two times a day (BID) | ORAL | Status: DC | PRN

## 2014-08-08 MED ORDER — SIMETHICONE 80 MG PO CHEW: 80.0000 mg | CHEWABLE_TABLET | Freq: Four times a day (QID) | ORAL | Status: DC | PRN

## 2014-08-08 NOTE — Progress Notes (Signed)
Patient ID: Carrie Carpenter, female    DOB: 1965/09/02, 49 y.o.   MRN: [id number]  PCP: No PCP Per Patient  Subjective:   Chief Complaint  Patient presents with  . abdominal swelling    stomach keeps getting larger--even if she doesnt eat    HPI Presents for evaluation of swelling of the abdomen x 1 month. Progressively worsening. Normal BMs. No urinary symptoms. Normal menses. Has gained weight, estimates 7 lbs in the past month. She isn't able to button her pants. Not eating much because when she does, her stomach gets tight and she feels like she can't breathe. No improvement with BM or passing gas. No increase in flatulence or BM frequency.  The patient speaks some [place]. Her god-sister is here to help with translation as needed.    Review of Systems  Constitutional: Positive for unexpected weight change (weight gain). Negative for fever, chills, diaphoresis, activity change, appetite change and fatigue.  HENT: Negative for dental problem.   Eyes: Negative for visual disturbance.  Respiratory: Positive for shortness of breath (only when abdomen feels extra bloated and tight).   Cardiovascular: Negative for chest pain and palpitations.  Gastrointestinal: Positive for abdominal distention. Negative for nausea, vomiting, abdominal pain, diarrhea, constipation, blood in stool, anal bleeding and rectal pain.  Endocrine: Negative for cold intolerance, heat intolerance, polydipsia, polyphagia and polyuria.  Genitourinary: Negative for dysuria, urgency, frequency, hematuria, menstrual problem and pelvic pain.  Musculoskeletal: Negative for myalgias and arthralgias.  Skin: Negative for rash.  Neurological: Negative for dizziness, weakness and light-headedness.  Hematological: Negative for adenopathy. Does not bruise/bleed easily.       Patient Active Problem List   Diagnosis Date Noted  . HTN (hypertension) 03/13/2012  . Insomnia 03/13/2012     Prior to Admission medications     Medication Sig Start Date End Date Taking? Authorizing Provider  amitriptyline (ELAVIL) 25 MG tablet Take 1 tablet (25 mg total) by mouth at bedtime. 03/04/14   Penni Bombard, MD  rizatriptan (MAXALT-MLT) 10 MG disintegrating tablet Take 1 tablet (10 mg total) by mouth as needed for migraine. May repeat in 2 hours if needed 03/04/14   Penni Bombard, MD     No Known Allergies     Objective:  Physical Exam  Constitutional: She is oriented to person, place, and time. She appears well-developed and well-nourished. She is active and cooperative. No distress.  BP 134/84 mmHg  Pulse 85  Temp(Src) 98.3 F (36.8 C) (Oral)  Resp 16  Ht 5\' 1"  (1.549 m)  Wt 132 lb 2 oz (59.932 kg)  BMI 24.98 kg/m2  SpO2 98%  LMP 08/05/2014   Eyes: Conjunctivae and EOM are normal. No scleral icterus.  Neck: Neck supple. No thyromegaly present.  Cardiovascular: Normal rate, regular rhythm, normal heart sounds and intact distal pulses.   Pulmonary/Chest: Effort normal and breath sounds normal.  Abdominal: Soft. Bowel sounds are normal. She exhibits no distension and no mass. There is no tenderness. There is no rebound and no guarding.  Lymphadenopathy:    She has no cervical adenopathy.  Neurological: She is alert and oriented to person, place, and time.  Skin: Skin is warm and dry.  Psychiatric: She has a normal mood and affect. Her speech is normal and behavior is normal.   Results for orders placed or performed in visit on 08/08/14  POCT urine pregnancy  Result Value Ref Range   Preg Test, Ur Negative      Acute  Abdominal Series: [facility] reading (PRIMARY) by  Dr. Everlene Farrier. Large stool burden on the LEFT. Air-fluid levels noted in the RIGHT colon. Slow gastric transit noted by gastric volume (patient last ate a small snack >6 hours ago).          Assessment & Plan:   1. Abdominal bloating 2. Early satiety Concern for partial blockage. Neoplasm in differential (colon, ovarian). CT scan.  Trial of Miralax and simethicone, though suspect they will not be effective. Small, frequent snacks. Hydrate. Await remaining labs.  - DG Abd Acute W/Chest - POCT urine pregnancy - CT Abdomen Pelvis W Wo Contrast; Future - CBC with Differential/Platelet - Comprehensive metabolic panel - TSH - polyethylene glycol powder (GLYCOLAX/MIRALAX) powder; Take 17 g by mouth 2 (two) times daily as needed.  Dispense: 3350 g; Refill: 1 - simethicone (GAS-X) 80 MG chewable tablet; Chew 1 tablet (80 mg total) by mouth every 6 (six) hours as needed for flatulence.  Dispense: 30 tablet; Refill: 0    Fara Chute, PA-C Physician Assistant-Certified [facility] .

## 2014-08-08 NOTE — Patient Instructions (Addendum)
We will plan to have the CT scan done early next week (hopefully Monday). I've specified that you need an evening appointment.  If you develop pain, nausea or vomiting, inability to pass stool or gas, please come back or go to the emergency department.

## 2014-08-09 LAB — COMPREHENSIVE METABOLIC PANEL
ALK PHOS: 50 U/L (ref 39–117)
ALT: 17 U/L (ref 0–35)
AST: 23 U/L (ref 0–37)
Albumin: 4.1 g/dL (ref 3.5–5.2)
BILIRUBIN TOTAL: 0.3 mg/dL (ref 0.2–1.2)
BUN: 13 mg/dL (ref 6–23)
CO2: 25 meq/L (ref 19–32)
Calcium: 10 mg/dL (ref 8.4–10.5)
Chloride: 105 mEq/L (ref 96–112)
Creat: 0.62 mg/dL (ref 0.50–1.10)
Glucose, Bld: 105 mg/dL — ABNORMAL HIGH (ref 70–99)
Potassium: 3.8 mEq/L (ref 3.5–5.3)
Sodium: 138 mEq/L (ref 135–145)
Total Protein: 7.2 g/dL (ref 6.0–8.3)

## 2014-08-09 LAB — CBC WITH DIFFERENTIAL/PLATELET
BASOS PCT: 1 % (ref 0–1)
Basophils Absolute: 0.1 10*3/uL (ref 0.0–0.1)
Eosinophils Absolute: 0.2 10*3/uL (ref 0.0–0.7)
Eosinophils Relative: 4 % (ref 0–5)
HCT: 30.6 % — ABNORMAL LOW (ref 36.0–46.0)
Hemoglobin: 9.1 g/dL — ABNORMAL LOW (ref 12.0–15.0)
LYMPHS PCT: 44 % (ref 12–46)
Lymphs Abs: 2.2 10*3/uL (ref 0.7–4.0)
MCH: 19.9 pg — ABNORMAL LOW (ref 26.0–34.0)
MCHC: 29.7 g/dL — ABNORMAL LOW (ref 30.0–36.0)
MCV: 66.8 fL — AB (ref 78.0–100.0)
Monocytes Absolute: 0.5 10*3/uL (ref 0.1–1.0)
Monocytes Relative: 9 % (ref 3–12)
Neutro Abs: 2.1 10*3/uL (ref 1.7–7.7)
Neutrophils Relative %: 42 % — ABNORMAL LOW (ref 43–77)
Platelets: 327 10*3/uL (ref 150–400)
RBC: 4.58 MIL/uL (ref 3.87–5.11)
RDW: 18 % — ABNORMAL HIGH (ref 11.5–15.5)
WBC: 5.1 10*3/uL (ref 4.0–10.5)

## 2014-08-09 LAB — TSH: TSH: 1.513 u[IU]/mL (ref 0.350–4.500)

## 2014-08-11 ENCOUNTER — Ambulatory Visit (HOSPITAL_COMMUNITY): Payer: PRIVATE HEALTH INSURANCE

## 2014-08-12 ENCOUNTER — Other Ambulatory Visit: Payer: Self-pay | Admitting: Physician Assistant

## 2014-08-12 ENCOUNTER — Ambulatory Visit (HOSPITAL_COMMUNITY)
Admission: RE | Admit: 2014-08-12 | Discharge: 2014-08-12 | Disposition: A | Discharge status: Home / Self Care | Payer: PRIVATE HEALTH INSURANCE | Source: Ambulatory Visit | Attending: Physician Assistant | Admitting: Physician Assistant

## 2014-08-12 DIAGNOSIS — R140 Abdominal distension (gaseous): Secondary | ICD-10-CM | POA: Insufficient documentation

## 2014-08-12 MED ORDER — IOHEXOL 300 MG/ML  SOLN
100.0000 mL | Freq: Once | INTRAMUSCULAR | Status: AC | PRN
  Administered 2014-08-12: 100 mL via INTRAVENOUS

## 2014-08-13 NOTE — Addendum Note (Signed)
Addended by: Fara Chute on: 08/13/2014 12:39 PM   Modules accepted: Orders

## 2014-08-18 ENCOUNTER — Ambulatory Visit: Payer: PRIVATE HEALTH INSURANCE | Admitting: Diagnostic Neuroimaging

## 2014-09-11 ENCOUNTER — Other Ambulatory Visit: Payer: Self-pay | Admitting: Physician Assistant

## 2014-12-24 ENCOUNTER — Other Ambulatory Visit: Payer: Self-pay | Admitting: Family Medicine

## 2016-01-20 ENCOUNTER — Other Ambulatory Visit: Payer: Self-pay | Admitting: Physician Assistant

## 2016-01-20 DIAGNOSIS — Z1231 Encounter for screening mammogram for malignant neoplasm of breast: Secondary | ICD-10-CM

## 2016-01-22 ENCOUNTER — Ambulatory Visit
Admission: RE | Admit: 2016-01-22 | Discharge: 2016-01-22 | Disposition: A | Discharge status: Home / Self Care | Payer: PRIVATE HEALTH INSURANCE | Source: Ambulatory Visit | Attending: Physician Assistant | Admitting: Physician Assistant

## 2016-01-22 DIAGNOSIS — Z1231 Encounter for screening mammogram for malignant neoplasm of breast: Secondary | ICD-10-CM | POA: Insufficient documentation

## 2017-01-06 ENCOUNTER — Encounter: Payer: PRIVATE HEALTH INSURANCE | Admitting: Obstetrics and Gynecology

## 2017-01-18 ENCOUNTER — Encounter: Payer: Self-pay | Admitting: Obstetrics & Gynecology

## 2017-01-18 ENCOUNTER — Ambulatory Visit (INDEPENDENT_AMBULATORY_CARE_PROVIDER_SITE_OTHER): Payer: PRIVATE HEALTH INSURANCE | Admitting: Obstetrics & Gynecology

## 2017-01-18 VITALS — BP 150/100 | HR 84 | Ht 62.0 in | Wt 126.0 lb

## 2017-01-18 DIAGNOSIS — R1032 Left lower quadrant pain: Secondary | ICD-10-CM

## 2017-01-18 DIAGNOSIS — N949 Unspecified condition associated with female genital organs and menstrual cycle: Secondary | ICD-10-CM

## 2017-01-18 DIAGNOSIS — N9489 Other specified conditions associated with female genital organs and menstrual cycle: Secondary | ICD-10-CM

## 2017-01-18 DIAGNOSIS — Z124 Encounter for screening for malignant neoplasm of cervix: Secondary | ICD-10-CM

## 2017-01-18 NOTE — Progress Notes (Signed)
Gynecology Pelvic Pain Evaluation   Chief Complaint:  Chief Complaint  Patient presents with  . Abdominal Pain    pain on left side     History of Present Illness:   Patient is a 51 y.o. [id number] who LMP was Patient's last menstrual period was 01/09/2017., presents today for a problem visit.  She complains of pain.   Her pain is localized to the LLQ area, described as intermittent and stabbing, began one year ago (steadily worsening) and its severity is described as severe. The pain radiates to the  Non-radiating. She has these associated symptoms which include abdominal pain. Patient has these modifiers which include relaxation that make it better and unable to associate with any factor that make it worse.  Context includes: spontaneous.  She notes regular periods every 28 days.  Previous evaluation: none. Prior Diagnosis: none. Previous Treatment: none.  PMHx: She  has a past medical history of Allergy and Hypertension. Also,  has a past surgical history that includes Tubal ligation., family history includes Healthy in her brother, daughter, daughter, mother, and son.,  reports that she has never smoked. She has never used smokeless tobacco. She reports that she does not drink alcohol or use drugs.  She has a current medication list which includes the following prescription(s): albuterol, amitriptyline, polyethylene glycol powder, rizatriptan, and simethicone. Also, has No Known Allergies.  Review of Systems  Constitutional: Negative for chills, fever and malaise/fatigue.  HENT: Negative for congestion, sinus pain and sore throat.   Eyes: Negative for blurred vision and pain.  Respiratory: Negative for cough and wheezing.   Cardiovascular: Negative for chest pain and leg swelling.  Gastrointestinal: Negative for abdominal pain, constipation, diarrhea, heartburn, nausea and vomiting.  Genitourinary: Negative for dysuria, frequency, hematuria and urgency.  Musculoskeletal: Negative  for back pain, joint pain, myalgias and neck pain.  Skin: Negative for itching and rash.  Neurological: Negative for dizziness, tremors and weakness.  Endo/Heme/Allergies: Does not bruise/bleed easily.  Psychiatric/Behavioral: Negative for depression. The patient is not nervous/anxious and does not have insomnia.     Objective: BP (!) 150/100   Pulse 84   Ht 5\' 2"  (1.575 m)   Wt 126 lb (57.2 kg)   LMP 01/09/2017   BMI 23.05 kg/m  Physical Exam  Constitutional: She is oriented to person, place, and time. She appears well-developed and well-nourished. No distress.  Genitourinary: Rectum normal, vagina normal and uterus normal. Pelvic exam was performed with patient supine. There is no rash or lesion on the right labia. There is no rash or lesion on the left labia. Vagina exhibits no lesion. No bleeding in the vagina. Right adnexum does not display mass and does not display tenderness.  Left adnexum displays mass, displays tenderness and displays fullness. Cervix does not exhibit motion tenderness, lesion, friability or polyp.   Uterus is mobile and midaxial. Uterus is not enlarged or exhibiting a mass.  HENT:  Head: Normocephalic and atraumatic. Head is without laceration.  Right Ear: Hearing normal.  Left Ear: Hearing normal.  Nose: No epistaxis.  No foreign bodies.  Mouth/Throat: Uvula is midline, oropharynx is clear and moist and mucous membranes are normal.  Eyes: Pupils are equal, round, and reactive to light.  Neck: Normal range of motion. Neck supple. No thyromegaly present.  Cardiovascular: Normal rate and regular rhythm.  Exam reveals no gallop and no friction rub.   No murmur heard. Pulmonary/Chest: Effort normal and breath sounds normal. No respiratory distress. She has no wheezes.  Right breast exhibits no mass, no skin change and no tenderness. Left breast exhibits no mass, no skin change and no tenderness.  Abdominal: Soft. Bowel sounds are normal. She exhibits no  distension. There is tenderness in the left lower quadrant. There is no rebound, no guarding, no tenderness at McBurney's point and negative Murphy's sign.  Musculoskeletal: Normal range of motion.  Neurological: She is alert and oriented to person, place, and time. No cranial nerve deficit.  Skin: Skin is warm and dry.  Psychiatric: She has a normal mood and affect. Judgment normal.  Vitals reviewed.  Female chaperone present for pelvic portion of the physical exam  Assessment: 51 y.o. [phone] with pain and mass- new finding, will need more diagnostic testing and therapeutic interventions.  1. Left lower quadrant pain - US PELVIS TRANSVANGINAL NON-OB (TV ONLY); Future 2. Left adnexal mass - CA125, [place]  Likely need for surgery due to yearlong pain.  Risks of cyst, mass, fibroid, cancer discussed.  Barnett Applebaum, MD, [facility], [facility] 01/18/2017  3:57 PM

## 2017-01-19 ENCOUNTER — Telehealth: Payer: Self-pay

## 2017-01-19 LAB — CA 125: Cancer Antigen (CA) 125: 11.7 U/mL (ref 0.0–38.1)

## 2017-01-19 NOTE — Progress Notes (Signed)
D/w pt (and husband)- reassuring CA125 results.  Keep [place] appt Monday

## 2017-01-19 NOTE — Addendum Note (Signed)
Addended by: Gae Dry on: 01/19/2017 02:47 PM   Modules accepted: Orders

## 2017-01-22 ENCOUNTER — Other Ambulatory Visit: Payer: Self-pay | Admitting: Physician Assistant

## 2017-01-22 DIAGNOSIS — Z1231 Encounter for screening mammogram for malignant neoplasm of breast: Secondary | ICD-10-CM

## 2017-01-23 ENCOUNTER — Ambulatory Visit (INDEPENDENT_AMBULATORY_CARE_PROVIDER_SITE_OTHER): Payer: PRIVATE HEALTH INSURANCE | Admitting: Obstetrics & Gynecology

## 2017-01-23 ENCOUNTER — Ambulatory Visit (INDEPENDENT_AMBULATORY_CARE_PROVIDER_SITE_OTHER): Payer: PRIVATE HEALTH INSURANCE

## 2017-01-23 ENCOUNTER — Encounter: Payer: Self-pay | Admitting: Obstetrics & Gynecology

## 2017-01-23 VITALS — BP 140/90 | HR 69 | Ht 62.0 in | Wt 125.0 lb

## 2017-01-23 DIAGNOSIS — D219 Benign neoplasm of connective and other soft tissue, unspecified: Secondary | ICD-10-CM | POA: Insufficient documentation

## 2017-01-23 DIAGNOSIS — R1032 Left lower quadrant pain: Secondary | ICD-10-CM | POA: Insufficient documentation

## 2017-01-23 NOTE — Patient Instructions (Signed)
Total Laparoscopic Hysterectomy A total laparoscopic hysterectomy is a minimally invasive surgery to remove your uterus and cervix. This surgery is performed by making several small cuts (incisions) in your abdomen. It can also be done with a thin, lighted tube (laparoscope) inserted into two small incisions in your lower abdomen. Your fallopian tubes and ovaries can be removed (bilateral salpingo-oophorectomy) during this surgery as well.Benefits of minimally invasive surgery include:  Less pain.  Less risk of blood loss.  Less risk of infection.  Quicker return to normal activities.  Tell a health care provider about:  Any allergies you have.  All medicines you are taking, including vitamins, herbs, eye drops, creams, and over-the-counter medicines.  Any problems you or family members have had with anesthetic medicines.  Any blood disorders you have.  Any surgeries you have had.  Any medical conditions you have. What are the risks? Generally, this is a safe procedure. However, as with any procedure, complications can occur. Possible complications include:  Bleeding.  Blood clots in the legs or lung.  Infection.  Injury to surrounding organs.  Problems with anesthesia.  Early menopause symptoms (hot flashes, night sweats, insomnia).  Risk of conversion to an open abdominal incision.  What happens before the procedure?  Ask your health care provider about changing or stopping your regular medicines.  Do not take aspirin or blood thinners (anticoagulants) for 1 week before the surgery or as told by your health care provider.  Do not eat or drink anything for 8 hours before the surgery or as told by your health care provider.  Quit smoking if you smoke.  Arrange for a ride home after surgery and for someone to help you at home during recovery. What happens during the procedure?  You will be given antibiotic medicine.  An IV tube will be placed in your arm. You  will be given medicine to make you sleep (general anesthetic).  A gas (carbon dioxide) will be used to inflate your abdomen. This will allow your surgeon to look inside your abdomen, perform your surgery, and treat any other problems found if necessary.  Three or four small incisions (often less than 1/2 inch) will be made in your abdomen. One of these incisions will be made in the area of your belly button (navel). The laparoscope will be inserted into the incision. Your surgeon will look through the laparoscope while doing your procedure.  Other surgical instruments will be inserted through the other incisions.  Your uterus may be removed through your vagina or cut into small pieces and removed through the small incisions.  Your incisions will be closed. What happens after the procedure?  The gas will be released from inside your abdomen.  You will be taken to the recovery area where a nurse will watch and check your progress. Once you are awake, stable, and taking fluids well, without other problems, you will return to your room or be allowed to go home.  There is usually minimal discomfort following the surgery because the incisions are so small.  You will be given pain medicine while you are in the hospital and for when you go home. This information is not intended to replace advice given to you by your health care provider. Make sure you discuss any questions you have with your health care provider. Document Released: 01/23/2007 Document Revised: 09/03/2015 Document Reviewed: 10/16/2012 Elsevier Interactive Patient Education  2017 [place] on [place] and Lupron given to patient also.

## 2017-01-23 NOTE — Progress Notes (Signed)
  HPI: Patient presents with left sided pain.  Her pain is localized to the LLQ area, described as intermittent and stabbing, began one year ago (steadily worsening) and its severity is described as severe. The pain radiates to the  Non-radiating. She has these associated symptoms which include abdominal pain. Patient has these modifiers which include relaxation that make it better and unable to associate with any factor that make it worse.  Context includes: spontaneous. She notes regular periods every 28 days. No intermenstrual bleeding, spotting, or discharge. No fertility desired.  Prior tubal.  Ultrasound demonstrates 3 fibroids, 2, 1.5, and 1 cm in diameter all IM and on Left Side of uterus These findings are Pelvis abnormal and may be cause of sx's    PMHx: She  has a past medical history of Allergy and Hypertension. Also,  has a past surgical history that includes Tubal ligation., family history includes Healthy in her brother, daughter, daughter, mother, and son.,  reports that she has never smoked. She has never used smokeless tobacco. She reports that she does not drink alcohol or use drugs.  She has a current medication list which includes the following prescription(s): albuterol, amitriptyline, polyethylene glycol powder, rizatriptan, and simethicone. Also, has No Known Allergies.  Review of Systems  All other systems reviewed and are negative.  Objective: BP 140/90   Pulse 69   Ht 5\' 2"  (1.575 m)   Wt 125 lb (56.7 kg)   LMP 01/09/2017   BMI 22.86 kg/m   Physical examination Constitutional NAD, Conversant  Skin No rashes, lesions or ulceration.   Extremities: Moves all appropriately.  Normal ROM for age. No lymphadenopathy.  Neuro: Grossly intact  Psych: Oriented to PPT.  Normal mood. Normal affect.   Assessment:  Fibroid and Left Lower Quadrant Pain  (also reports some bladder urgency, may be related)   (new, unstable problem)  Options for fibroid treatment  discussed, [place], Lupron, Myomectomy, and Hysterectomy discussed in great detail.  Pt would like TLH.  Info on [place], Lupron, and TLH gv to pt today and will discuss again by phone and then schedule if desires surgery at that time. Recovery discussed as well.  Barnett Applebaum, MD, [facility], [facility] 01/23/2017  4:15 PM

## 2017-01-24 ENCOUNTER — Telehealth: Payer: Self-pay | Admitting: Obstetrics & Gynecology

## 2017-01-24 NOTE — Telephone Encounter (Signed)
Patient has decided she would like surgery 12/27.  Best cb home #.

## 2017-01-24 NOTE — Telephone Encounter (Signed)
Lmtrc

## 2017-01-24 NOTE — Telephone Encounter (Signed)
-----   Message from Gae Dry, MD sent at 01/23/2017  4:06 PM EDT ----- Regarding: Surg Surgery Booking Request Patient Full Name:  Carrie Carpenter MRN: [id number]  DOB: 06-Nov-1965  Surgeon: [name], MD  Requested Surgery Date and Time: DEC 18 or 27, pt to decide by time call tomorrow Primary Diagnosis AND Code: Fibroid uterus and LLQ pain Secondary Diagnosis and Code:  Surgical Procedure: TLH/BS L&D Notification: No Admission Status: same day surgery Length of Surgery: 1 hr Special Case Needs: no H&P: yes (date) Phone Interview???: yes Interpreter: Language:  Medical Clearance: no Special Scheduling Instructions: no

## 2017-01-25 ENCOUNTER — Ambulatory Visit
Admission: RE | Admit: 2017-01-25 | Discharge: 2017-01-25 | Disposition: A | Discharge status: Home / Self Care | Payer: PRIVATE HEALTH INSURANCE | Source: Ambulatory Visit | Attending: Physician Assistant | Admitting: Physician Assistant

## 2017-01-25 DIAGNOSIS — Z1231 Encounter for screening mammogram for malignant neoplasm of breast: Secondary | ICD-10-CM | POA: Insufficient documentation

## 2017-01-25 LAB — PAP IG (IMAGE GUIDED): PAP Smear Comment: 0

## 2017-01-25 NOTE — Telephone Encounter (Signed)
Lmtrc w/ phone# and ext. If the patient returns the call, I need to speak w/ her to schedule preop appts.

## 2017-01-26 NOTE — Telephone Encounter (Signed)
Patient called from work, and gave me permission to speak w/ her co-worker, Sophia.  H&P appointment scheduled.

## 2017-01-31 ENCOUNTER — Telehealth: Payer: Self-pay | Admitting: Obstetrics & Gynecology

## 2017-01-31 NOTE — Telephone Encounter (Signed)
Per Heather @ Pre-admit Testing, the patient's phone interview needed to be converted to an office appointment due to needing an interpreter. Patient was rescheduled to 03/31/17 @ 9am. I contacted the patient, who gave me permission to speak to Conni Elliot, HR, Alexander Bergeron and the new appointment info was given on speaker phone to the patient and Ms. Araceli Bouche. Patient is aware she will still have the H&P appt on 03/30/17 @ 4:30pm at [facility] w/ Dr. Kenton Kingfisher and then have the Pre-admit Testing on 03/31/17 @ 9am at [facility]. Patient agrees and thanked me for the call. I have requested interpretive services for both appts.

## 2017-03-30 ENCOUNTER — Encounter: Payer: Self-pay | Admitting: Obstetrics & Gynecology

## 2017-03-30 ENCOUNTER — Ambulatory Visit (INDEPENDENT_AMBULATORY_CARE_PROVIDER_SITE_OTHER): Payer: PRIVATE HEALTH INSURANCE | Admitting: Obstetrics & Gynecology

## 2017-03-30 VITALS — BP 140/80 | Ht 62.0 in | Wt 128.0 lb

## 2017-03-30 DIAGNOSIS — D219 Benign neoplasm of connective and other soft tissue, unspecified: Secondary | ICD-10-CM

## 2017-03-30 DIAGNOSIS — R1032 Left lower quadrant pain: Secondary | ICD-10-CM | POA: Diagnosis not present

## 2017-03-30 NOTE — Patient Instructions (Signed)

## 2017-03-30 NOTE — Progress Notes (Signed)
PRE-OPERATIVE HISTORY AND PHYSICAL EXAM  HPI:  Carrie Carpenter is a 51 y.o. G3P3003 No LMP recorded.; she is being admitted for surgery related to fibroids.  Her pain is localized to the [facility], described as intermittent and stabbing, began one year ago (steadily worsening)and its severity is described as severe. The pain radiates to the Non-radiating. She has these associated symptoms which include abdominal pain. Patient has these modifiers which include relaxationthat make it better and unable to associate with any factorthat make it worse. Context includes: spontaneous. She notes regular periods every 28days.  Also has bladder urgency. No intermenstrual bleeding, spotting, or discharge. No fertility desired.  Prior tubal.  Ultrasound demonstrates 3 fibroids, 2, 1.5, and 1 cm in diameter all IM and on Left Side of uterus  PMHx: Past Medical History:  Diagnosis Date  . Allergy   . Hypertension    Past Surgical History:  Procedure Laterality Date  . TUBAL LIGATION     Family History  Problem Relation Age of Onset  . Healthy Daughter   . Healthy Daughter   . Healthy Mother   . Healthy Brother   . Healthy Son    Social History   Tobacco Use  . Smoking status: Never Smoker  . Smokeless tobacco: Never Used  Substance Use Topics  . Alcohol use: No    Alcohol/week: 0.0 oz  . Drug use: No    Current Outpatient Medications:  .  diphenhydrAMINE (ALLERGY RELIEF) 25 mg capsule, Take 25 mg by mouth daily as needed for allergies., Disp: , Rfl:  .  ibuprofen (ADVIL,MOTRIN) 200 MG tablet, Take 400 mg by mouth every 8 (eight) hours as needed for mild pain or moderate pain., Disp: , Rfl:  .  omeprazole (PRILOSEC) 20 MG capsule, Take 20 mg by mouth daily as needed., Disp: , Rfl:  Allergies: Patient has no known allergies.  Review of Systems  Constitutional: Negative for chills, fever and malaise/fatigue.  HENT: Negative for congestion, sinus pain and sore throat.   Eyes:  Negative for blurred vision and pain.  Respiratory: Negative for cough and wheezing.   Cardiovascular: Negative for chest pain and leg swelling.  Gastrointestinal: Negative for abdominal pain, constipation, diarrhea, heartburn, nausea and vomiting.  Genitourinary: Negative for dysuria, frequency, hematuria and urgency.  Musculoskeletal: Negative for back pain, joint pain, myalgias and neck pain.  Skin: Negative for itching and rash.  Neurological: Negative for dizziness, tremors and weakness.  Endo/Heme/Allergies: Does not bruise/bleed easily.  Psychiatric/Behavioral: Negative for depression. The patient is not nervous/anxious and does not have insomnia.     Objective: BP 140/80   Ht 5\' 2"  (1.575 m)   Wt 128 lb (58.1 kg)   BMI 23.41 kg/m   Filed Weights   03/30/17 1636  Weight: 128 lb (58.1 kg)   Physical Exam  Constitutional: She is oriented to person, place, and time. She appears well-developed and well-nourished. No distress.  Genitourinary: Rectum normal, vagina normal and uterus normal. Pelvic exam was performed with patient supine. There is no rash or lesion on the right labia. There is no rash or lesion on the left labia. Vagina exhibits no lesion. No bleeding in the vagina. Right adnexum does not display mass and does not display tenderness. Left adnexum does not display mass and does not display tenderness. Cervix does not exhibit motion tenderness, lesion, friability or polyp.   Uterus is mobile and midaxial. Uterus is not enlarged or exhibiting a mass.  HENT:  Head: Normocephalic and atraumatic. Head is without laceration.  Right Ear: Hearing normal.  Left Ear: Hearing normal.  Nose: No epistaxis.  No foreign bodies.  Mouth/Throat: Uvula is midline, oropharynx is clear and moist and mucous membranes are normal.  Eyes: Pupils are equal, round, and reactive to light.  Neck: Normal range of motion. Neck supple. No thyromegaly present.  Cardiovascular: Normal rate and  regular rhythm. Exam reveals no gallop and no friction rub.  No murmur heard. Pulmonary/Chest: Effort normal and breath sounds normal. No respiratory distress. She has no wheezes. Right breast exhibits no mass, no skin change and no tenderness. Left breast exhibits no mass, no skin change and no tenderness.  Abdominal: Soft. Bowel sounds are normal. She exhibits no distension. There is no tenderness. There is no rebound.  Musculoskeletal: Normal range of motion.  Neurological: She is alert and oriented to person, place, and time. No cranial nerve deficit.  Skin: Skin is warm and dry.  Psychiatric: She has a normal mood and affect. Judgment normal.  Vitals reviewed.  Assessment: 1. Fibroid   2. LLQ pain   Options discussed, desires hysterectomy.  Pris and cons discussed.  Ovarian preservation vs removal also discussed (to keep ovaries).   I have had a careful discussion with this patient about all the options available and the risk/benefits of each. I have fully informed this patient that surgery may subject her to a variety of discomforts and risks: She understands that most patients have surgery with little difficulty, but problems can happen ranging from minor to fatal. These include nausea, vomiting, pain, bleeding, infection, poor healing, hernia, or formation of adhesions. Unexpected reactions may occur from any drug or anesthetic given. Unintended injury may occur to other pelvic or abdominal structures such as Fallopian tubes, ovaries, bladder, ureter (tube from kidney to bladder), or bowel. Nerves going from the pelvis to the legs may be injured. Any such injury may require immediate or later additional surgery to correct the problem. Excessive blood loss requiring transfusion is very unlikely but possible. Dangerous blood clots may form in the legs or lungs. Physical and sexual activity will be restricted in varying degrees for an indeterminate period of time but most often 2-6 weeks.   Finally, she understands that it is impossible to list every possible undesirable effect and that the condition for which surgery is done is not always cured or significantly improved, and in rare cases may be even worse.Ample time was given to answer all questions.  Barnett Applebaum, MD, [facility], [facility] 03/30/2017  5:02 PM

## 2017-03-31 ENCOUNTER — Other Ambulatory Visit: Payer: Self-pay

## 2017-03-31 ENCOUNTER — Encounter
Admission: RE | Admit: 2017-03-31 | Discharge: 2017-03-31 | Disposition: A | Discharge status: Home / Self Care | Payer: PRIVATE HEALTH INSURANCE | Source: Ambulatory Visit | Attending: Obstetrics & Gynecology | Admitting: Obstetrics & Gynecology

## 2017-03-31 DIAGNOSIS — Z01812 Encounter for preprocedural laboratory examination: Secondary | ICD-10-CM | POA: Insufficient documentation

## 2017-03-31 DIAGNOSIS — R1032 Left lower quadrant pain: Secondary | ICD-10-CM | POA: Diagnosis not present

## 2017-03-31 DIAGNOSIS — D219 Benign neoplasm of connective and other soft tissue, unspecified: Secondary | ICD-10-CM | POA: Diagnosis not present

## 2017-03-31 HISTORY — DX: Gastro-esophageal reflux disease without esophagitis: K21.9

## 2017-03-31 HISTORY — DX: Headache: R51

## 2017-03-31 HISTORY — DX: Headache, unspecified: R51.9

## 2017-03-31 LAB — TYPE AND SCREEN
ABO/RH(D): B POS
Antibody Screen: NEGATIVE

## 2017-03-31 LAB — CBC
HCT: 34.4 % — ABNORMAL LOW (ref 35.0–47.0)
HEMOGLOBIN: 10.6 g/dL — AB (ref 12.0–16.0)
MCH: 20.3 pg — AB (ref 26.0–34.0)
MCHC: 30.8 g/dL — AB (ref 32.0–36.0)
MCV: 66.1 fL — AB (ref 80.0–100.0)
PLATELETS: 244 10*3/uL (ref 150–440)
RBC: 5.21 MIL/uL — AB (ref 3.80–5.20)
RDW: 17.8 % — ABNORMAL HIGH (ref 11.5–14.5)
WBC: 5.7 10*3/uL (ref 3.6–11.0)

## 2017-03-31 MED ORDER — FAMOTIDINE 20 MG PO TABS
20.0000 mg | ORAL_TABLET | Freq: Once | ORAL | Status: DC
  Filled 2017-03-31: qty 1

## 2017-03-31 NOTE — Pre-Procedure Instructions (Signed)
[facility] - DAUGHTER WITH PATIENT DID NOT WANT INTERPRETER FOR PRE ADMIT VISIT BUT WANTS AN INTERPRETER FOR DAY OF SURGERY

## 2017-03-31 NOTE — Patient Instructions (Signed)
Your procedure is scheduled on: Thursday April 06, 2017 Report to Same Day Surgery on the 2nd floor in the [facility]. To find out your arrival time, please call [phone] between 1PM - 3PM on: Wednesday April 05, 2017  REMEMBER: Instructions that are not followed completely may result in serious medical risk, up to and including death; or upon the discretion of your surgeon and anesthesiologist your surgery may need to be rescheduled.  Do not eat food after midnight the night before your procedure.  No gum chewing or hard candies.  You may however, drink CLEAR liquids up to 2 hours before you are scheduled to arrive at the hospital for your procedure.  Do not drink clear liquids within 2 hours of the start of your surgery.  Clear liquids include: - water  - apple juice without pulp - clear gatorade - black coffee or tea (Do NOT add anything to the coffee or tea) Do NOT drink anything that is not on this list.  Notify your doctor if there is any change in your medical condition (cold, fever, infection).  Do not wear jewelry, make-up, hairpins, clips or nail polish.  Do not wear lotions, powders, or perfumes. You may  NOT wear deodorant.  Do not shave 48 hours prior to surgery. Men may shave face and neck.  Contacts and dentures may not be worn into surgery.  Do not bring valuables to the hospital. [facility] is not responsible for any belongings or valuables.   TAKE THESE MEDICATIONS THE MORNING OF SURGERY WITH A SIP OF WATER: OMEPRAZOLE  TAKE A DOSE THE NIGHT BEFORE AND THE MORNING OF SURGERY   Use CHG Soap or wipes as directed on instruction sheet.  Stop Anti-inflammatories such as Advil, Aleve, Ibuprofen, Motrin, Naproxen, Naprosyn, Goodie powder, or aspirin products. (May take Tylenol or Acetaminophen if needed.)  If you are being discharged the day of surgery, you will not be allowed to drive home. You will need someone to drive you home and stay with you  that night.    Please call the number above if you have any questions about these instructions.

## 2017-04-05 MED ORDER — CEFOXITIN SODIUM-DEXTROSE 2-2.2 GM-%(50ML) IV SOLR
2.0000 g | INTRAVENOUS | Status: AC
  Administered 2017-04-06: 2 g via INTRAVENOUS

## 2017-04-06 ENCOUNTER — Encounter
Admission: RE | Disposition: A | Discharge status: Home / Self Care | Payer: Self-pay | Source: Ambulatory Visit | Attending: Obstetrics & Gynecology

## 2017-04-06 ENCOUNTER — Encounter: Payer: Self-pay | Admitting: Anesthesiology

## 2017-04-06 ENCOUNTER — Ambulatory Visit: Payer: PRIVATE HEALTH INSURANCE | Admitting: Anesthesiology

## 2017-04-06 ENCOUNTER — Ambulatory Visit
Admission: RE | Admit: 2017-04-06 | Discharge: 2017-04-06 | Disposition: A | Discharge status: Home / Self Care | Payer: PRIVATE HEALTH INSURANCE | Source: Ambulatory Visit | Attending: Obstetrics & Gynecology | Admitting: Obstetrics & Gynecology

## 2017-04-06 DIAGNOSIS — N888 Other specified noninflammatory disorders of cervix uteri: Secondary | ICD-10-CM | POA: Insufficient documentation

## 2017-04-06 DIAGNOSIS — K219 Gastro-esophageal reflux disease without esophagitis: Secondary | ICD-10-CM | POA: Insufficient documentation

## 2017-04-06 DIAGNOSIS — D219 Benign neoplasm of connective and other soft tissue, unspecified: Secondary | ICD-10-CM | POA: Diagnosis present

## 2017-04-06 DIAGNOSIS — N838 Other noninflammatory disorders of ovary, fallopian tube and broad ligament: Secondary | ICD-10-CM | POA: Diagnosis not present

## 2017-04-06 DIAGNOSIS — D259 Leiomyoma of uterus, unspecified: Secondary | ICD-10-CM | POA: Insufficient documentation

## 2017-04-06 DIAGNOSIS — R1032 Left lower quadrant pain: Secondary | ICD-10-CM | POA: Diagnosis present

## 2017-04-06 DIAGNOSIS — I10 Essential (primary) hypertension: Secondary | ICD-10-CM | POA: Insufficient documentation

## 2017-04-06 HISTORY — PX: CYSTOSCOPY: SHX5120

## 2017-04-06 HISTORY — PX: LAPAROSCOPIC HYSTERECTOMY: SHX1926

## 2017-04-06 HISTORY — PX: ABDOMINAL HYSTERECTOMY: SHX81

## 2017-04-06 LAB — ABO/RH: ABO/RH(D): B POS

## 2017-04-06 LAB — POCT PREGNANCY, URINE: Preg Test, Ur: NEGATIVE

## 2017-04-06 SURGERY — HYSTERECTOMY, TOTAL, LAPAROSCOPIC
Anesthesia: General | Laterality: Bilateral

## 2017-04-06 MED ORDER — KETOROLAC TROMETHAMINE 30 MG/ML IJ SOLN
INTRAMUSCULAR | Status: AC
  Filled 2017-04-06: qty 1

## 2017-04-06 MED ORDER — OXYCODONE HCL 5 MG/5ML PO SOLN: 5.0000 mg | Freq: Once | ORAL | Status: AC | PRN

## 2017-04-06 MED ORDER — FENTANYL CITRATE (PF) 100 MCG/2ML IJ SOLN
INTRAMUSCULAR | Status: AC
  Filled 2017-04-06: qty 2

## 2017-04-06 MED ORDER — PROPOFOL 10 MG/ML IV BOLUS
INTRAVENOUS | Status: DC | PRN
  Administered 2017-04-06: 20 mg via INTRAVENOUS
  Administered 2017-04-06: 150 mg via INTRAVENOUS

## 2017-04-06 MED ORDER — ACETAMINOPHEN 325 MG PO TABS: 650.0000 mg | ORAL_TABLET | ORAL | Status: DC | PRN

## 2017-04-06 MED ORDER — OXYCODONE-ACETAMINOPHEN 5-325 MG PO TABS: 1.0000 | ORAL_TABLET | ORAL | 0 refills | Status: DC | PRN

## 2017-04-06 MED ORDER — MIDAZOLAM HCL 2 MG/2ML IJ SOLN
INTRAMUSCULAR | Status: DC | PRN
  Administered 2017-04-06: 2 mg via INTRAVENOUS

## 2017-04-06 MED ORDER — PHENYLEPHRINE HCL 10 MG/ML IJ SOLN
INTRAMUSCULAR | Status: DC | PRN
  Administered 2017-04-06 (×2): 80 ug via INTRAVENOUS

## 2017-04-06 MED ORDER — OXYCODONE HCL 5 MG PO TABS
5.0000 mg | ORAL_TABLET | Freq: Once | ORAL | Status: AC | PRN
  Administered 2017-04-06: 5 mg via ORAL

## 2017-04-06 MED ORDER — EPHEDRINE SULFATE 50 MG/ML IJ SOLN
INTRAMUSCULAR | Status: DC | PRN
  Administered 2017-04-06: 10 mg via INTRAVENOUS

## 2017-04-06 MED ORDER — OXYCODONE HCL 5 MG PO TABS
ORAL_TABLET | ORAL | Status: AC
  Filled 2017-04-06: qty 1

## 2017-04-06 MED ORDER — MIDAZOLAM HCL 2 MG/2ML IJ SOLN
INTRAMUSCULAR | Status: AC
  Filled 2017-04-06: qty 2

## 2017-04-06 MED ORDER — LIDOCAINE HCL (CARDIAC) 20 MG/ML IV SOLN
INTRAVENOUS | Status: DC | PRN
  Administered 2017-04-06: 80 mg via INTRAVENOUS

## 2017-04-06 MED ORDER — ROCURONIUM BROMIDE 100 MG/10ML IV SOLN
INTRAVENOUS | Status: DC | PRN
  Administered 2017-04-06: 10 mg via INTRAVENOUS
  Administered 2017-04-06: 30 mg via INTRAVENOUS

## 2017-04-06 MED ORDER — MORPHINE SULFATE (PF) 4 MG/ML IV SOLN: 1.0000 mg | INTRAVENOUS | Status: DC | PRN

## 2017-04-06 MED ORDER — ONDANSETRON HCL 4 MG/2ML IJ SOLN
INTRAMUSCULAR | Status: AC
  Filled 2017-04-06: qty 2

## 2017-04-06 MED ORDER — LACTATED RINGERS IV SOLN: INTRAVENOUS | Status: DC

## 2017-04-06 MED ORDER — FENTANYL CITRATE (PF) 100 MCG/2ML IJ SOLN
25.0000 ug | INTRAMUSCULAR | Status: DC | PRN
  Administered 2017-04-06 (×2): 50 ug via INTRAVENOUS

## 2017-04-06 MED ORDER — BUPIVACAINE HCL (PF) 0.5 % IJ SOLN
INTRAMUSCULAR | Status: AC
  Filled 2017-04-06: qty 30

## 2017-04-06 MED ORDER — SUGAMMADEX SODIUM 200 MG/2ML IV SOLN
INTRAVENOUS | Status: AC
  Filled 2017-04-06: qty 2

## 2017-04-06 MED ORDER — KETOROLAC TROMETHAMINE 30 MG/ML IJ SOLN
INTRAMUSCULAR | Status: DC | PRN
  Administered 2017-04-06: 30 mg via INTRAVENOUS

## 2017-04-06 MED ORDER — ROCURONIUM BROMIDE 50 MG/5ML IV SOLN
INTRAVENOUS | Status: AC
  Filled 2017-04-06: qty 1

## 2017-04-06 MED ORDER — CEFOXITIN SODIUM-DEXTROSE 2-2.2 GM-%(50ML) IV SOLR
INTRAVENOUS | Status: AC
  Filled 2017-04-06: qty 50

## 2017-04-06 MED ORDER — ACETAMINOPHEN 10 MG/ML IV SOLN
INTRAVENOUS | Status: DC | PRN
  Administered 2017-04-06: 1000 mg via INTRAVENOUS

## 2017-04-06 MED ORDER — DEXAMETHASONE SODIUM PHOSPHATE 10 MG/ML IJ SOLN
INTRAMUSCULAR | Status: DC | PRN
  Administered 2017-04-06: 10 mg via INTRAVENOUS

## 2017-04-06 MED ORDER — ONDANSETRON HCL 4 MG/2ML IJ SOLN
INTRAMUSCULAR | Status: DC | PRN
  Administered 2017-04-06: 4 mg via INTRAVENOUS

## 2017-04-06 MED ORDER — LIDOCAINE HCL (PF) 2 % IJ SOLN
INTRAMUSCULAR | Status: AC
  Filled 2017-04-06: qty 10

## 2017-04-06 MED ORDER — SUGAMMADEX SODIUM 200 MG/2ML IV SOLN
INTRAVENOUS | Status: DC | PRN
  Administered 2017-04-06: 116.2 mg via INTRAVENOUS

## 2017-04-06 MED ORDER — ACETAMINOPHEN 650 MG RE SUPP: 650.0000 mg | RECTAL | Status: DC | PRN

## 2017-04-06 MED ORDER — FENTANYL CITRATE (PF) 250 MCG/5ML IJ SOLN
INTRAMUSCULAR | Status: AC
  Filled 2017-04-06: qty 5

## 2017-04-06 MED ORDER — LACTATED RINGERS IV SOLN
INTRAVENOUS | Status: DC
  Administered 2017-04-06: 11:00:00 via INTRAVENOUS

## 2017-04-06 MED ORDER — DEXAMETHASONE SODIUM PHOSPHATE 10 MG/ML IJ SOLN
INTRAMUSCULAR | Status: AC
  Filled 2017-04-06: qty 1

## 2017-04-06 MED ORDER — PROPOFOL 10 MG/ML IV BOLUS
INTRAVENOUS | Status: AC
  Filled 2017-04-06: qty 20

## 2017-04-06 MED ORDER — FENTANYL CITRATE (PF) 100 MCG/2ML IJ SOLN
INTRAMUSCULAR | Status: DC | PRN
  Administered 2017-04-06: 50 ug via INTRAVENOUS
  Administered 2017-04-06: 150 ug via INTRAVENOUS
  Administered 2017-04-06: 50 ug via INTRAVENOUS

## 2017-04-06 MED ORDER — BUPIVACAINE HCL (PF) 0.5 % IJ SOLN
INTRAMUSCULAR | Status: DC | PRN
  Administered 2017-04-06: 6 mL

## 2017-04-06 MED ORDER — ACETAMINOPHEN NICU IV SYRINGE 10 MG/ML
INTRAVENOUS | Status: AC
  Filled 2017-04-06: qty 1

## 2017-04-06 MED ORDER — KETOROLAC TROMETHAMINE 30 MG/ML IJ SOLN: 30.0000 mg | Freq: Four times a day (QID) | INTRAMUSCULAR | Status: DC

## 2017-04-06 MED ORDER — SUCCINYLCHOLINE CHLORIDE 20 MG/ML IJ SOLN
INTRAMUSCULAR | Status: DC | PRN
  Administered 2017-04-06: 60 mg via INTRAVENOUS

## 2017-04-06 MED ORDER — SUCCINYLCHOLINE CHLORIDE 20 MG/ML IJ SOLN
INTRAMUSCULAR | Status: AC
  Filled 2017-04-06: qty 2

## 2017-04-06 SURGICAL SUPPLY — 52 items
ADH SKN CLS APL DERMABOND .7 (GAUZE/BANDAGES/DRESSINGS) ×6
BAG URINE DRAINAGE (UROLOGICAL SUPPLIES) ×4 IMPLANT
BLADE SURG SZ11 CARB STEEL (BLADE) ×4 IMPLANT
CANISTER SUCT 1200ML W/VALVE (MISCELLANEOUS) ×4 IMPLANT
CATH FOLEY 2WAY  5CC 16FR (CATHETERS) ×2
CATH FOLEY 2WAY 5CC 16FR (CATHETERS) ×2
CATH URTH 16FR FL 2W BLN LF (CATHETERS) ×2 IMPLANT
CHLORAPREP W/TINT 26ML (MISCELLANEOUS) ×4 IMPLANT
DEFOGGER SCOPE WARMER CLEARIFY (MISCELLANEOUS) ×4 IMPLANT
DERMABOND ADVANCED (GAUZE/BANDAGES/DRESSINGS) ×6
DERMABOND ADVANCED .7 DNX12 (GAUZE/BANDAGES/DRESSINGS) ×2 IMPLANT
DEVICE SUTURE ENDOST 10MM (ENDOMECHANICALS) ×4 IMPLANT
DRAPE CAMERA CLOSED 9X96 (DRAPES) ×2 IMPLANT
DRSG TEGADERM 2-3/8X2-3/4 SM (GAUZE/BANDAGES/DRESSINGS) IMPLANT
GAUZE SPONGE NON-WVN 2X2 STRL (MISCELLANEOUS) IMPLANT
GLOVE BIO SURGEON STRL SZ8 (GLOVE) ×20 IMPLANT
GLOVE INDICATOR 8.0 STRL GRN (GLOVE) ×12 IMPLANT
GOWN STRL REUS W/ TWL LRG LVL3 (GOWN DISPOSABLE) ×2 IMPLANT
GOWN STRL REUS W/ TWL XL LVL3 (GOWN DISPOSABLE) ×4 IMPLANT
GOWN STRL REUS W/TWL LRG LVL3 (GOWN DISPOSABLE) ×4
GOWN STRL REUS W/TWL XL LVL3 (GOWN DISPOSABLE) ×8
GRASPER SUT TROCAR 14GX15 (MISCELLANEOUS) ×4 IMPLANT
IRRIGATION STRYKERFLOW (MISCELLANEOUS) ×2 IMPLANT
IRRIGATOR STRYKERFLOW (MISCELLANEOUS) ×4
IV LACTATED RINGERS 1000ML (IV SOLUTION) ×7 IMPLANT
KIT PINK PAD W/HEAD ARE REST (MISCELLANEOUS) ×4
KIT PINK PAD W/HEAD ARM REST (MISCELLANEOUS) ×2 IMPLANT
KIT RM TURNOVER CYSTO AR (KITS) ×4 IMPLANT
LABEL OR SOLS (LABEL) ×4 IMPLANT
MANIPULATOR VCARE LG CRV RETR (MISCELLANEOUS) IMPLANT
MANIPULATOR VCARE SML CRV RETR (MISCELLANEOUS) IMPLANT
MANIPULATOR VCARE STD CRV RETR (MISCELLANEOUS) ×2 IMPLANT
NEEDLE VERESS 14GA 120MM (NEEDLE) ×4 IMPLANT
NS IRRIG 500ML POUR BTL (IV SOLUTION) ×4 IMPLANT
OCCLUDER COLPOPNEUMO (BALLOONS) ×4 IMPLANT
PACK GYN LAPAROSCOPIC (MISCELLANEOUS) ×4 IMPLANT
PAD OB MATERNITY 4.3X12.25 (PERSONAL CARE ITEMS) ×4 IMPLANT
PAD PREP 24X41 OB/GYN DISP (PERSONAL CARE ITEMS) ×4 IMPLANT
PENCIL ELECTRO HAND CTR (MISCELLANEOUS) ×2 IMPLANT
SCISSORS METZENBAUM CVD 33 (INSTRUMENTS) ×2 IMPLANT
SET CYSTO W/LG BORE CLAMP LF (SET/KITS/TRAYS/PACK) ×4 IMPLANT
SHEARS HARMONIC ACE PLUS 36CM (ENDOMECHANICALS) ×4 IMPLANT
SLEEVE ENDOPATH XCEL 5M (ENDOMECHANICALS) ×4 IMPLANT
SPONGE VERSALON 2X2 STRL (MISCELLANEOUS)
SUT ENDO VLOC 180-0-8IN (SUTURE) ×3 IMPLANT
SUT VIC AB 0 CT1 36 (SUTURE) ×4 IMPLANT
SUT VIC AB 4-0 FS2 27 (SUTURE) ×3 IMPLANT
SYR 10ML LL (SYRINGE) ×4 IMPLANT
SYR 50ML LL SCALE MARK (SYRINGE) ×4 IMPLANT
TROCAR ENDO BLADELESS 11MM (ENDOMECHANICALS) ×4 IMPLANT
TROCAR XCEL NON-BLD 5MMX100MML (ENDOMECHANICALS) ×4 IMPLANT
TUBING INSUF HEATED (TUBING) ×4 IMPLANT

## 2017-04-06 NOTE — Anesthesia Postprocedure Evaluation (Signed)
Anesthesia Post Note  Patient: Carrie Carpenter  Procedure(s) Performed: HYSTERECTOMY TOTAL LAPAROSCOPIC BILATERAL SALPINGECTOMY (Bilateral ) CYSTOSCOPY  Patient location during evaluation: PACU Anesthesia Type: General Level of consciousness: awake Pain management: pain level controlled Vital Signs Assessment: post-procedure vital signs reviewed and stable Respiratory status: spontaneous breathing Cardiovascular status: stable Anesthetic complications: no     Last Vitals:  Vitals:   04/06/17 1326 04/06/17 1340  BP: (!) 158/86 (!) 153/79  Pulse:  79  Resp: 17 19  Temp: (!) 36.1 C   SpO2:  100%    Last Pain:  Vitals:   04/06/17 1326  TempSrc:   PainSc: 0-No pain                 VAN STAVEREN,Esli Jernigan

## 2017-04-06 NOTE — H&P (Signed)
History and Physical Interval Note:  04/06/2017 11:04 AM  Carrie Carpenter  has presented today for surgery, with the diagnosis of [place] PAIN  The various methods of treatment have been discussed with the patient and family. After consideration of risks, benefits and other options for treatment, the patient has consented to  Procedure(s): HYSTERECTOMY TOTAL LAPAROSCOPIC BILATERAL SALPINGECTOMY (Bilateral) as a surgical intervention .  The patient's history has been reviewed, patient examined, no change in status, stable for surgery.  Pt has the following beta blocker history-  Not taking Beta Blocker.  I have reviewed the patient's chart and labs.  Questions were answered to the patient's satisfaction.    Barnett Applebaum, MD, [facility], [facility] 04/06/2017  11:04 AM

## 2017-04-06 NOTE — Anesthesia Post-op Follow-up Note (Signed)
Anesthesia QCDR form completed.        

## 2017-04-06 NOTE — Discharge Instructions (Signed)

## 2017-04-06 NOTE — Op Note (Signed)
Operative Report:  PRE-OP DIAGNOSIS: FIBROID UTERUS,LOWER LEFT QUADRANT PAIN   POST-OP DIAGNOSIS: FIBROID UTERUS,LOWER LEFT QUADRANT PAIN   PROCEDURE: Procedure(s): HYSTERECTOMY TOTAL LAPAROSCOPIC BILATERAL SALPINGECTOMY  SURGEON: Barnett Applebaum, MD, FACOG  ASSISTANT: Dr Marisue Brooklyn   ANESTHESIA: General endotracheal anesthesia  ESTIMATED BLOOD LOSS: less than 50   SPECIMENS: Uterus, Tubes.  COMPLICATIONS: None  DISPOSITION: stable to PACU  FINDINGS: Intraabdominal adhesions were not noted. Fibroids noted w uterus.  Normal ovaries  PROCEDURE:  The patient was taken to the OR where anesthesia was administed. She was prepped and draped in the normal sterile fashion in the dorsal lithotomy position in the [address] stirrups. A time out was performed. A Graves speculum was inserted, the cervix was grasped with a single tooth tenaculum and the endometrial cavity was sounded. The cervix was progressively dilated to a size 18 [place] with [employer] dilators. A V-Care uterine manipulator was inserted in the usual fashion without incident. Gloves were changed and attention was turned to the abdomen.   An infraumbilical transverse 54mm skin incision was made with the scalpel after local anesthesia applied to the skin. A Veress-step needle was inserted in the usual fashion and confirmed using the hanging drop technique. A pneumoperitoneum was obtained by insufflation of CO2 (opening pressure of 27mmHg) to 60mmHg. A diagnostic laparoscopy was performed yielding the previously described findings. Attention was turned to the left lower quadrant where after visualization of the inferior epigastric vessels a 39mm skin incision was made with the scalpel. A 5 mm laparoscopic port was inserted. The same procedure was repeated in the right lower quadrant with a 34mm trocar. Attention was turned to the left aspect of the uterus, where after visualization of the ureter, the round ligament was coagulated and transected using  the 73mm Harmonic Scapel. The anterior and posterior leafs of the broad ligament were dissected off as the anterior one was coagulated and transected in a caudal direction towards the cuff of the uterine manipulator.  Attention was then turned to the left fallopian tube which was recognized by visualization of the fimbria. The tube is excised to its attachment to the uterus. The uterine-ovarian ligament and its blood vessels were carefully coagulated and transected using the Harmonic scapel.  Attention was turned to the right aspect of the uterus where the same procedure was performed.  The vesicouterine reflection of the peritoneum was dissected with the harmonic scapel and the bladder flap was created bluntly.  The uterine vessels were coagulated and transected bilaterally using first bipolar cautery and then the harmonic scapel. A 360 degree, circumferential colpotomy was done to completely amputate the uterus with cervix and tubes. Once the specimen was amputated it was delivered through the vagina.   The colpotomy was repaired in a simple interrupted fashion using a 0-Polysorb suture with an endo-stitch device.  Vaginal exam confirms complete closure.  The cavity was copiously irrigated. A survey of the pelvic cavity revealed adequate hemostasis and no injury to bowel, bladder, or ureter.   A diagnostic cystoscopy was performed using saline distension of bladder with no lesions or injuries noted.  Bilateral urine flow from each ureteral orifice is visualized.  At this point the procedure was finalized. All the instruments were removed from the patient's body. Gas was expelled and patient is leveled.  Incisions are closed with skin adhesive.    Patient goes to recovery room in stable condition.  All sponge, instrument, and needle counts are correct x2.     Barnett Applebaum, MD, Cherlynn June  [place], [facility] 04/06/2017  1:16 PM

## 2017-04-06 NOTE — Transfer of Care (Signed)
Immediate Anesthesia Transfer of Care Note  Patient: Carrie Carpenter  Procedure(s) Performed: HYSTERECTOMY TOTAL LAPAROSCOPIC BILATERAL SALPINGECTOMY (Bilateral )  Patient Location: PACU  Anesthesia Type:General  Level of Consciousness: awake, alert  and oriented  Airway & Oxygen Therapy: Patient Spontanous Breathing and Patient connected to nasal cannula oxygen  Post-op Assessment: Report given to RN and Post -op Vital signs reviewed and stable  Post vital signs: Reviewed and stable  Last Vitals:  Vitals:   04/06/17 1103  BP: (!) 171/108  Pulse: 85  Resp: 17  Temp: 36.7 C  SpO2: 100%    Last Pain:  Vitals:   04/06/17 1103  TempSrc: Oral         Complications: No apparent anesthesia complications

## 2017-04-06 NOTE — Anesthesia Preprocedure Evaluation (Signed)
Anesthesia Evaluation  Patient identified by MRN, date of birth, ID band Patient awake    Reviewed: Allergy & Precautions, H&P , NPO status , Patient's Chart, lab work & pertinent test results  History of Anesthesia Complications Negative for: history of anesthetic complications  Airway Mallampati: III  TM Distance: <3 FB Neck ROM: full    Dental  (+) Chipped, Poor Dentition   Pulmonary neg pulmonary ROS, neg shortness of breath,           Cardiovascular Exercise Tolerance: Good hypertension, (-) angina(-) Past MI and (-) DOE      Neuro/Psych  Headaches, negative psych ROS   GI/Hepatic Neg liver ROS, GERD  Medicated and Controlled,  Endo/Other  negative endocrine ROS  Renal/GU      Musculoskeletal   Abdominal   Peds  Hematology negative hematology ROS (+)   Anesthesia Other Findings Past Medical History: No date: Allergy No date: GERD (gastroesophageal reflux disease) No date: Headache     Comment:  migraines No date: Hypertension  Past Surgical History: No date: TUBAL LIGATION  BMI    Body Mass Index:  23.41 kg/m      Reproductive/Obstetrics negative OB ROS                             Anesthesia Physical Anesthesia Plan  ASA: II  Anesthesia Plan: General ETT   Post-op Pain Management:    Induction: Intravenous  PONV Risk Score and Plan: Ondansetron, Dexamethasone and Midazolam  Airway Management Planned: Oral ETT  Additional Equipment:   Intra-op Plan:   Post-operative Plan: Extubation in OR  Informed Consent: I have reviewed the patients History and Physical, chart, labs and discussed the procedure including the risks, benefits and alternatives for the proposed anesthesia with the patient or authorized representative who has indicated his/her understanding and acceptance.   Dental Advisory Given  Plan Discussed with: Anesthesiologist, CRNA and  Surgeon  Anesthesia Plan Comments: (Consent via interpreter, family and patient.   Patient consented for risks of anesthesia including but not limited to:  - adverse reactions to medications - damage to teeth, lips or other oral mucosa - sore throat or hoarseness - Damage to heart, brain, lungs or loss of life  Patient voiced understanding.)        Anesthesia Quick Evaluation

## 2017-04-06 NOTE — Anesthesia Procedure Notes (Signed)
Procedure Name: Intubation Performed by: Demetrius Charity, CRNA Pre-anesthesia Checklist: Patient identified, Patient being monitored, Timeout performed, Emergency Drugs available and Suction available Patient Re-evaluated:Patient Re-evaluated prior to induction Oxygen Delivery Method: Circle system utilized Preoxygenation: Pre-oxygenation with 100% oxygen Induction Type: IV induction Ventilation: Mask ventilation without difficulty Laryngoscope Size: Mac and 3 Grade View: Grade II Tube type: Oral Tube size: 7.5 mm Number of attempts: 1 Airway Equipment and Method: Stylet Placement Confirmation: ETT inserted through vocal cords under direct vision,  positive ETCO2 and breath sounds checked- equal and bilateral Secured at: 22 cm Tube secured with: Tape Dental Injury: Teeth and Oropharynx as per pre-operative assessment

## 2017-04-07 ENCOUNTER — Encounter: Payer: Self-pay | Admitting: Obstetrics & Gynecology

## 2017-04-07 LAB — SURGICAL PATHOLOGY

## 2017-04-21 ENCOUNTER — Encounter: Payer: Self-pay | Admitting: Obstetrics & Gynecology

## 2017-04-21 ENCOUNTER — Ambulatory Visit (INDEPENDENT_AMBULATORY_CARE_PROVIDER_SITE_OTHER): Payer: PRIVATE HEALTH INSURANCE | Admitting: Obstetrics & Gynecology

## 2017-04-21 ENCOUNTER — Telehealth: Payer: Self-pay

## 2017-04-21 VITALS — BP 130/90 | HR 81 | Ht 61.0 in | Wt 124.0 lb

## 2017-04-21 DIAGNOSIS — D219 Benign neoplasm of connective and other soft tissue, unspecified: Secondary | ICD-10-CM

## 2017-04-21 MED ORDER — OXYCODONE-ACETAMINOPHEN 5-325 MG PO TABS: 1.0000 | ORAL_TABLET | ORAL | 0 refills | Status: DC | PRN

## 2017-04-21 NOTE — Telephone Encounter (Signed)
FMLA/DISABILITY form for [employer] filled out, signature obtained, given to [place] for processing

## 2017-04-21 NOTE — Progress Notes (Signed)
  [name] presents post op from [facility] for fibroids, 2 weeks ago.  Subjective: Patient reports some improvement in her preop symptoms. Eating a regular diet without difficulty. Pain is controlled with current analgesics. Medications being used: ibuprofen (OTC) and narcotic analgesics including Percocet.  Activity: normal activities of daily living. Patient reports vaginal sx's of None  Objective: BP 130/90   Pulse 81   Ht 5\' 1"  (1.549 m)   Wt 124 lb (56.2 kg)   BMI 23.43 kg/m  Physical Exam  Constitutional: She is oriented to person, place, and time. She appears well-developed and well-nourished. No distress.  Cardiovascular: Normal rate.  Pulmonary/Chest: Effort normal.  Abdominal: Soft. She exhibits no distension. There is no tenderness.  Incision Healing Well   Musculoskeletal: Normal range of motion.  Neurological: She is alert and oriented to person, place, and time. No cranial nerve deficit.  Skin: Skin is warm and dry.  Psychiatric: She has a normal mood and affect.   Assessment: s/p :  total laparoscopic hysterectomy with bilateral salpingectomy stable  Plan: Patient has done well after surgery with no apparent complications.  I have discussed the post-operative course to date, and the expected progress moving forward.  The patient understands what complications to be concerned about.  I will see the patient in routine follow up, or sooner if needed.    Activity plan: No heavy lifting. Pelvic rest  [name] 04/21/2017, 9:52 AM

## 2017-04-27 ENCOUNTER — Telehealth: Payer: Self-pay

## 2017-04-27 NOTE — Telephone Encounter (Signed)
FMLA/DISABILITY form for [employer] filled out, signature obtained, and given to [place] for processing.

## 2017-05-03 ENCOUNTER — Ambulatory Visit (INDEPENDENT_AMBULATORY_CARE_PROVIDER_SITE_OTHER): Payer: PRIVATE HEALTH INSURANCE | Admitting: Obstetrics & Gynecology

## 2017-05-03 ENCOUNTER — Encounter: Payer: Self-pay | Admitting: Obstetrics & Gynecology

## 2017-05-03 VITALS — BP 138/90 | HR 98 | Ht 61.0 in | Wt 124.0 lb

## 2017-05-03 DIAGNOSIS — L7682 Other postprocedural complications of skin and subcutaneous tissue: Secondary | ICD-10-CM | POA: Diagnosis not present

## 2017-05-03 NOTE — Progress Notes (Signed)
  Patient presents with pain in RLW under incisionl also reports some drainage form LLQ incision.  She is post op from [facility] for fibroids, 1 month ago.  Subjective: Denies fever, dysuria, bowel changes. Drainage was scant, clear. Pain is mild, RLQ without radiation, last few days, no assoc sx's, no modifers other than rest and IBF.  Objective: BP 138/90   Pulse 98   Ht 5\' 1"  (1.549 m)   Wt 124 lb (56.2 kg)   BMI 23.43 kg/m  Physical Exam  Constitutional: She is oriented to person, place, and time. She appears well-developed and well-nourished. No distress.  Cardiovascular: Normal rate.  Pulmonary/Chest: Effort normal.  Abdominal: Soft. She exhibits no distension. There is no tenderness.  Incision Healing Well; LLQ incision w min separation and no erythema or drainage  Musculoskeletal: Normal range of motion.  Neurological: She is alert and oriented to person, place, and time. No cranial nerve deficit.  Skin: Skin is warm and dry.  Psychiatric: She has a normal mood and affect.   Assessment: RLQ incisional pain, no s/sx hematoma, abscess, mass, or other more serious concerns.  Incision on left healing appropriately  Plan: Monitor pain, incisions, bowel habits, activity Hold on going back to work for now (RTW note for Feb 9 given, will see pt Feb 8)  [name] 05/03/2017, 8:21 AM

## 2017-05-19 ENCOUNTER — Encounter: Payer: Self-pay | Admitting: Obstetrics & Gynecology

## 2017-05-19 ENCOUNTER — Ambulatory Visit (INDEPENDENT_AMBULATORY_CARE_PROVIDER_SITE_OTHER): Payer: PRIVATE HEALTH INSURANCE | Admitting: Obstetrics & Gynecology

## 2017-05-19 VITALS — BP 118/84 | HR 60 | Wt 124.0 lb

## 2017-05-19 DIAGNOSIS — D219 Benign neoplasm of connective and other soft tissue, unspecified: Secondary | ICD-10-CM

## 2017-05-19 NOTE — Progress Notes (Signed)
  [name] presents post op from [facility] for fibroids, 6 weeks ago. Images from surgery:    PATHOLOGY: DIAGNOSIS:  A. UTERUS WITH CERVIX AND BILATERAL FALLOPIAN TUBES; HYSTERECTOMY WITH  BILATERAL SALPINGECTOMY:  - NABOTHIAN CYST.  - SECRETORY ENDOMETRIUM.  - LEIOMYOMATA, UP TO 1.4 CM.  - UNILATERAL FALLOPIAN TUBE WITH PARATUBAL CYST.  - REMAINING FALLOPIAN TUBE WITHOUT PATHOLOGIC CHANGE.   Subjective: Patient reports some improvement in her preop symptoms. Eating a regular diet without difficulty. Pain is controlled with current analgesics. Medications being used: ibuprofen (OTC).  Activity: normal activities of daily living. Patient reports vaginal sx's of No VB.  Objective: BP 118/84 (BP Location: Left Arm, Patient Position: Sitting, Cuff Size: Normal)   Pulse 60   Wt 124 lb (56.2 kg)   BMI 23.43 kg/m  Physical Exam  Constitutional: She is oriented to person, place, and time. She appears well-developed and well-nourished. No distress.  Genitourinary: Rectum normal and vagina normal. Pelvic exam was performed with patient supine. There is no rash, tenderness or lesion on the right labia. There is no rash, tenderness or lesion on the left labia. No erythema or bleeding in the vagina. Right adnexum does not display mass and does not display tenderness. Left adnexum does not display mass and does not display tenderness.  Genitourinary Comments: Cervix and uterus absent. Vaginal cuff healing well.  Cardiovascular: Normal rate.  Pulmonary/Chest: Effort normal.  Abdominal: Soft. She exhibits no distension. There is no tenderness.  Incision healing well.  Musculoskeletal: Normal range of motion.  Neurological: She is alert and oriented to person, place, and time. No cranial nerve deficit.  Skin: Skin is warm and dry.  Psychiatric: She has a normal mood and affect.   Assessment: s/p :  total laparoscopic hysterectomy with bilateral salpingectomy  stable  Plan: Patient has done well after surgery with no apparent complications.  I have discussed the post-operative course to date, and the expected progress moving forward.  The patient understands what complications to be concerned about.  I will see the patient in routine follow up, or sooner if needed.    Activity plan: No restriction.  [name] 05/19/2017, 9:50 AM

## 2017-08-24 NOTE — Telephone Encounter (Signed)
Not sure why this encounter was open

## 2017-11-06 ENCOUNTER — Telehealth: Payer: Self-pay | Admitting: Obstetrics & Gynecology

## 2017-11-06 ENCOUNTER — Encounter: Payer: Self-pay | Admitting: Obstetrics & Gynecology

## 2017-11-06 ENCOUNTER — Ambulatory Visit (INDEPENDENT_AMBULATORY_CARE_PROVIDER_SITE_OTHER): Payer: PRIVATE HEALTH INSURANCE | Admitting: Obstetrics & Gynecology

## 2017-11-06 VITALS — BP 138/90 | Ht 61.0 in | Wt 128.0 lb

## 2017-11-06 DIAGNOSIS — R102 Pelvic and perineal pain: Secondary | ICD-10-CM | POA: Insufficient documentation

## 2017-11-06 LAB — POCT URINALYSIS DIPSTICK
Appearance: NORMAL
Bilirubin, UA: NEGATIVE
Blood, UA: NEGATIVE
Glucose, UA: NEGATIVE
KETONES UA: NEGATIVE
Leukocytes, UA: NEGATIVE
NITRITE UA: NEGATIVE
PH UA: 5 (ref 5.0–8.0)
PROTEIN UA: NEGATIVE
Spec Grav, UA: 1.02 (ref 1.010–1.025)
UROBILINOGEN UA: 1 U/dL

## 2017-11-06 NOTE — Addendum Note (Signed)
Addended by: Quintella Baton D on: 11/06/2017 09:16 AM   Modules accepted: Orders

## 2017-11-06 NOTE — Progress Notes (Signed)
Gynecology Pelvic Pain Evaluation   Chief Complaint:  Chief Complaint  Patient presents with  . Pelvic Pain    History of Present Illness:   Patient is a 52 y.o. [id number] who LMP was Patient's last menstrual period was 03/01/2017., presents today for a problem visit.  She complains of pain.   Her pain is localized to the suprapubic area, described as intermittent, sharp and stabbing, began 2 mos ago (after healing from [facility] 12/18 and no pain fron 2/19 until 5/19) and its severity is described as moderate. The pain radiates to the  Non-radiating. She has these associated symptoms which include none. Patient has these modifiers which include pain medication that make it better and unable to associate with any factor that make it worse.  Context includes: spontaneous.  Past history includes fibroids w [facility] 03/2017.  PMHx: She  has a past medical history of Allergy, GERD (gastroesophageal reflux disease), Headache, and Hypertension. Also,  has a past surgical history that includes Tubal ligation; Laparoscopic hysterectomy (Bilateral, 04/06/2017); Cystoscopy (04/06/2017); and Abdominal hysterectomy (04/06/2017)., family history includes Healthy in her brother, daughter, daughter, mother, and son.,  reports that she has never smoked. She has never used smokeless tobacco. She reports that she does not drink alcohol or use drugs.  She currently has no medications in their medication list. Also, has No Known Allergies.  Review of Systems  Constitutional: Negative for chills, fever and malaise/fatigue.  HENT: Negative for congestion, sinus pain and sore throat.   Eyes: Negative for blurred vision and pain.  Respiratory: Negative for cough and wheezing.   Cardiovascular: Negative for chest pain and leg swelling.  Gastrointestinal: Negative for abdominal pain, constipation, diarrhea, heartburn, nausea and vomiting.  Genitourinary: Negative for dysuria, frequency, hematuria and urgency.    Musculoskeletal: Negative for back pain, joint pain, myalgias and neck pain.  Skin: Negative for itching and rash.  Neurological: Negative for dizziness, tremors and weakness.  Endo/Heme/Allergies: Does not bruise/bleed easily.  Psychiatric/Behavioral: Negative for depression. The patient is not nervous/anxious and does not have insomnia.    Objective: BP 138/90   Ht 5\' 1"  (1.549 m)   Wt 128 lb (58.1 kg)   LMP 03/01/2017   BMI 24.19 kg/m  Physical Exam  Constitutional: She is oriented to person, place, and time. She appears well-developed and well-nourished. No distress.  Genitourinary: Vagina normal. Pelvic exam was performed with patient supine. There is no rash, tenderness or lesion on the right labia. There is no rash, tenderness or lesion on the left labia. No erythema or bleeding in the vagina.  Genitourinary Comments: Cuff intact/ no lesions Mild T midline, no mass and no adnexal T Absent uterus and cervix  HENT:  Head: Normocephalic and atraumatic.  Nose: Nose normal.  Mouth/Throat: Oropharynx is clear and moist.  Abdominal: Soft. She exhibits no distension. There is tenderness in the suprapubic area. There is no rebound, no guarding, no tenderness at McBurney's point and negative Murphy's sign. No hernia.  Musculoskeletal: Normal range of motion.  Neurological: She is alert and oriented to person, place, and time. No cranial nerve deficit.  Skin: Skin is warm and dry.  Psychiatric: She has a normal mood and affect.   Female chaperone present for pelvic portion of the physical exam  Assessment: 51 y.o. [id number] with Pelvic Pain.  Adhesions, GI pain etiology, musculoskeletal, other..  There are no diagnoses linked to this encounter. Problem List Items Addressed This Visit      Other   Pelvic  pain in female - Primary    Imaging discussed.  As less likely GYN, will order CT over [place].  Referral based on results or based on her not improving over time.  Barnett Applebaum, MD,  [facility], [facility] 11/06/2017  8:58 AM

## 2017-11-06 NOTE — Telephone Encounter (Signed)
Attempted to reach the patient via [employer], [place] (ID# [id number]), who could not complete the call on mobile# [phone] and lmtrc on home# [phone].

## 2017-11-08 NOTE — Telephone Encounter (Signed)
Attempted to reach the patient via [address], [place] (ID# [phone]), who lmtrc on home# [phone].

## 2017-11-13 NOTE — Telephone Encounter (Signed)
Attempted to reach the patient via [employer], [place] (ID# [phone]), but the patient was not at home. No authorization on DPR to speak w/ daughter, who answered phone, so no message was left.

## 2017-11-14 NOTE — Telephone Encounter (Signed)
Attempted to reach the patient via [place] (ID# [phone]), but the patient was not at home. No authorization on DPR to speak w/ daughter, who answered phone, so no message was left.

## 2017-11-14 NOTE — Telephone Encounter (Signed)
I was able to reach the patient again via [employer], [place] (ID# [phone]). Patient is aware there were no appointments for this Friday morning. Patient is aware she is rescheduled for next Friday morning, 11/24/17 @ 8:00am, and should arrive @ 7:45am at [facility] [address], [place], and that [employer] is off [address] beside the new dental office, and across from [facility]. Patient is aware she must be NPO 4 hours prior to the test. Patient is aware she must pick up a prep kit anytime between now and next Thursday, but no later than next Thursday. Patient is aware she can pick up the prep kit either at [facility] before 6pm, or [facility] [place] before 5pm. Patient did not have any questions.

## 2017-11-14 NOTE — Telephone Encounter (Signed)
I was able to reach the patient via [employer], [place] (ID# [phone]), who gave the patient the appointment information for tomorrow, 11/15/17 @ 8:00am, but the patient is dependent on her daughter to bring her and the daughter is in school. Patient requests Friday morning. Patient is aware she must be NPO after midnight and must pick up a prep kit at the [facility].

## 2017-11-15 ENCOUNTER — Ambulatory Visit: Payer: No Typology Code available for payment source

## 2017-11-24 ENCOUNTER — Ambulatory Visit
Admission: RE | Admit: 2017-11-24 | Discharge: 2017-11-24 | Disposition: A | Discharge status: Home / Self Care | Payer: PRIVATE HEALTH INSURANCE | Source: Ambulatory Visit | Attending: Obstetrics & Gynecology | Admitting: Obstetrics & Gynecology

## 2017-11-24 DIAGNOSIS — R102 Pelvic and perineal pain: Secondary | ICD-10-CM | POA: Diagnosis not present

## 2017-11-24 DIAGNOSIS — Z90710 Acquired absence of both cervix and uterus: Secondary | ICD-10-CM | POA: Diagnosis not present

## 2017-11-24 MED ORDER — IOPAMIDOL (ISOVUE-300) INJECTION 61%
85.0000 mL | Freq: Once | INTRAVENOUS | Status: AC | PRN
  Administered 2017-11-24: 85 mL via INTRAVENOUS

## 2017-11-24 NOTE — Progress Notes (Signed)
Let her know CT is normal.  May need interpreter.

## 2017-11-24 NOTE — Progress Notes (Signed)
lmtrc

## 2017-11-28 NOTE — Progress Notes (Signed)
Called pt no answer °

## 2018-01-25 ENCOUNTER — Other Ambulatory Visit: Payer: Self-pay | Admitting: Physician Assistant

## 2018-01-25 DIAGNOSIS — Z1231 Encounter for screening mammogram for malignant neoplasm of breast: Secondary | ICD-10-CM

## 2018-01-30 ENCOUNTER — Ambulatory Visit
Admission: RE | Admit: 2018-01-30 | Discharge: 2018-01-30 | Disposition: A | Discharge status: Home / Self Care | Payer: No Typology Code available for payment source | Source: Ambulatory Visit | Attending: Physician Assistant | Admitting: Physician Assistant

## 2018-01-30 DIAGNOSIS — Z1231 Encounter for screening mammogram for malignant neoplasm of breast: Secondary | ICD-10-CM | POA: Diagnosis present

## 2018-04-24 ENCOUNTER — Encounter: Payer: Self-pay | Admitting: Family Medicine

## 2018-04-24 ENCOUNTER — Ambulatory Visit: Payer: PRIVATE HEALTH INSURANCE | Admitting: Family Medicine

## 2018-04-24 ENCOUNTER — Other Ambulatory Visit: Payer: Self-pay

## 2018-04-24 VITALS — BP 180/100 | HR 73 | Temp 98.4°F | Resp 14 | Ht 61.0 in | Wt 133.6 lb

## 2018-04-24 DIAGNOSIS — Z8669 Personal history of other diseases of the nervous system and sense organs: Secondary | ICD-10-CM | POA: Diagnosis not present

## 2018-04-24 DIAGNOSIS — R519 Headache, unspecified: Secondary | ICD-10-CM

## 2018-04-24 DIAGNOSIS — I10 Essential (primary) hypertension: Secondary | ICD-10-CM | POA: Diagnosis not present

## 2018-04-24 DIAGNOSIS — R51 Headache: Secondary | ICD-10-CM | POA: Diagnosis not present

## 2018-04-24 NOTE — Patient Instructions (Addendum)
Unfortunately I am concerned about your current headache and the headache this weekend along with your elevated blood pressure.  Please proceed to the emergency room today for further evaluation.  They can determine if imaging such as CAT scan is needed, can discuss starting blood pressure medicine temporarily until can follow-up with [place], and can give you something to treat migraines if they feel that is appropriate.  Return to the clinic or go to the nearest emergency room if any of your symptoms worsen or new symptoms occur.    If you have lab work done today you will be contacted with your lab results within the next 2 weeks.  If you have not heard from [place] then please contact us. The fastest way to get your results is to register for My Chart.   IF you received an x-ray today, you will receive an invoice from [facility]. Please contact [facility] at [phone] with questions or concerns regarding your invoice.   IF you received labwork today, you will receive an invoice from [facility]. Please contact [facility] at [phone] with questions or concerns regarding your invoice.   Our billing staff will not be able to assist you with questions regarding bills from these companies.  You will be contacted with the lab results as soon as they are available. The fastest way to get your results is to activate your My Chart account. Instructions are located on the last page of this paperwork. If you have not heard from [place] regarding the results in 2 weeks, please contact this office.
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
## 2018-04-25 ENCOUNTER — Encounter: Payer: Self-pay | Admitting: Emergency Medicine

## 2018-04-25 ENCOUNTER — Ambulatory Visit
Admission: EM | Admit: 2018-04-25 | Discharge: 2018-04-25 | Disposition: A | Discharge status: Home / Self Care | Payer: No Typology Code available for payment source | Attending: Physician Assistant | Admitting: Physician Assistant

## 2018-04-25 DIAGNOSIS — I10 Essential (primary) hypertension: Secondary | ICD-10-CM | POA: Diagnosis not present

## 2018-04-25 DIAGNOSIS — Z760 Encounter for issue of repeat prescription: Secondary | ICD-10-CM | POA: Diagnosis not present

## 2018-04-25 MED ORDER — LISINOPRIL 5 MG PO TABS: 5.0000 mg | ORAL_TABLET | Freq: Every day | ORAL | 1 refills | Status: DC

## 2018-04-25 NOTE — Discharge Instructions (Signed)
See your Physician for recheck in 2 weeks to make sure blood pressure is improving

## 2018-04-25 NOTE — ED Notes (Signed)
Patient able to ambulate independently  

## 2018-04-25 NOTE — ED Triage Notes (Signed)
Pt presents to [facility] for refill of her BP medication

## 2018-04-26 NOTE — ED Provider Notes (Signed)
[facility]    CSN: [id number] Arrival date & time: 04/25/18  1709     History   Chief Complaint Chief Complaint  Patient presents with  . Medication Refill    HPI Carrie Carpenter is a 53 y.o. female.   The history is provided by the patient. No language interpreter was used.  Medication Refill  Medications/supplies requested:  Blood pressure medication Reason for request:  Medications ran out and clinic/provider not available Medications taken before: yes - see home medications   Patient has complete original prescription information: no   Source of information:  Patient reported - no other verification available Pt is scheduled to see Molli Barrows next door in March.  Pt request rx    Past Medical History:  Diagnosis Date  . Allergy   . GERD (gastroesophageal reflux disease)   . Headache    migraines  . Hypertension     Patient Active Problem List   Diagnosis Date Noted  . Pelvic pain in female 11/06/2017  . HTN (hypertension) 03/13/2012  . Insomnia 03/13/2012    Past Surgical History:  Procedure Laterality Date  . ABDOMINAL HYSTERECTOMY  04/06/2017  . CYSTOSCOPY  04/06/2017   Procedure: CYSTOSCOPY;  Surgeon: Gae Dry, MD;  Location: [facility] ORS;  Service: Gynecology;;  . LAPAROSCOPIC HYSTERECTOMY Bilateral 04/06/2017   Procedure: HYSTERECTOMY TOTAL LAPAROSCOPIC BILATERAL SALPINGECTOMY;  Surgeon: Gae Dry, MD;  Location: [facility] ORS;  Service: Gynecology;  Laterality: Bilateral;  . TUBAL LIGATION      OB History    Gravida  3   Para  3   Term  3   Preterm      AB      Living  3     SAB      TAB      Ectopic      Multiple      Live Births               Home Medications    Prior to Admission medications   Medication Sig Start Date End Date Taking? Authorizing Provider  lisinopril (PRINIVIL,ZESTRIL) 5 MG tablet Take 1 tablet (5 mg total) by mouth daily. 04/25/18   Fransico Meadow, PA-C    Pseudoeph-Doxylamine-DM-APAP (NYQUIL PO) Take by mouth.    [provider]    Family History Family History  Problem Relation Age of Onset  . Healthy Daughter   . Healthy Daughter   . Healthy Mother   . Healthy Brother   . Healthy Son   . Breast cancer Neg Hx     Social History Social History   Tobacco Use  . Smoking status: Never Smoker  . Smokeless tobacco: Never Used  Substance Use Topics  . Alcohol use: No    Alcohol/week: 0.0 standard drinks  . Drug use: No     Allergies   Patient has no known allergies.   Review of Systems Review of Systems  All other systems reviewed and are negative.    Physical Exam Triage Vital Signs ED Triage Vitals [04/25/18 1725]  Enc Vitals Group     BP (!) 168/99     Pulse Rate 78     Resp 14     Temp 98 F (36.7 C)     Temp src      SpO2 99 %     Weight      Height      Head Circumference      Peak Flow  Pain Score      Pain Loc      Pain Edu?      Excl. in [place]?    No data found.  Updated Vital Signs BP (!) 168/99 (BP Location: Left Arm)   Pulse 78   Temp 98 F (36.7 C)   Resp 14   LMP 03/01/2017   SpO2 99%   Visual Acuity Right Eye Distance:   Left Eye Distance:   Bilateral Distance:    Right Eye Near:   Left Eye Near:    Bilateral Near:     Physical Exam Vitals signs and nursing note reviewed.  Constitutional:      Appearance: She is well-developed.  HENT:     Head: Normocephalic.     Nose: Nose normal.     Mouth/Throat:     Mouth: Mucous membranes are moist.  Neck:     Musculoskeletal: Normal range of motion.  Cardiovascular:     Rate and Rhythm: Normal rate.  Pulmonary:     Effort: Pulmonary effort is normal.  Abdominal:     General: Bowel sounds are normal. There is no distension.  Musculoskeletal: Normal range of motion.  Skin:    General: Skin is warm.  Neurological:     Mental Status: She is alert and oriented to person, place, and time.  Psychiatric:        Mood  and Affect: Mood normal.      UC Treatments / Results  Labs (all labs ordered are listed, but only abnormal results are displayed) Labs Reviewed - No data to display  EKG None  Radiology No results found.  Procedures Procedures (including critical care time)  Medications Ordered in UC Medications - No data to display  Initial Impression / Assessment and Plan / UC Course  I have reviewed the triage vital signs and the nursing notes.  Pertinent labs & imaging results that were available during my care of the patient were reviewed by me and considered in my medical decision making (see chart for details).     MDM  Pt given rx for lisinopril.   Pt advised to keep follow up appointment Final Clinical Impressions(s) / UC Diagnoses   Final diagnoses:  Essential hypertension     Discharge Instructions     See your Physician for recheck in 2 weeks to make sure blood pressure is improving   ED Prescriptions    Medication Sig Dispense Auth. Provider   lisinopril (PRINIVIL,ZESTRIL) 5 MG tablet Take 1 tablet (5 mg total) by mouth daily. 30 tablet Fransico Meadow, [place]     Controlled Substance Prescriptions [place] Controlled Substance Registry consulted? Not Applicable  An After Visit Summary was printed and given to the patient.   Fransico Meadow, [place] 04/26/18 1055

## 2019-01-07 ENCOUNTER — Other Ambulatory Visit: Payer: Self-pay | Admitting: Physician Assistant

## 2019-01-07 DIAGNOSIS — Z1231 Encounter for screening mammogram for malignant neoplasm of breast: Secondary | ICD-10-CM

## 2019-01-15 ENCOUNTER — Ambulatory Visit
Admission: RE | Admit: 2019-01-15 | Discharge: 2019-01-15 | Disposition: A | Discharge status: Home / Self Care | Payer: No Typology Code available for payment source | Source: Ambulatory Visit | Attending: Physician Assistant | Admitting: Physician Assistant

## 2019-01-15 DIAGNOSIS — Z1231 Encounter for screening mammogram for malignant neoplasm of breast: Secondary | ICD-10-CM | POA: Diagnosis not present

## 2019-06-24 ENCOUNTER — Ambulatory Visit: Payer: PRIVATE HEALTH INSURANCE | Admitting: Nurse Practitioner

## 2019-07-11 ENCOUNTER — Ambulatory Visit: Payer: PRIVATE HEALTH INSURANCE | Attending: Internal Medicine

## 2019-07-11 NOTE — Progress Notes (Signed)
   Covid-19 Vaccination Clinic  Name:  Carrie Carpenter    MRN: [id number] DOB: [birth year]  07/11/2019  [name] was observed post Covid-19 immunization for 15 minutes without incident. She was provided with Vaccine Information Sheet and instruction to access the V-Safe system.   [name] was instructed to call 911 with any severe reactions post vaccine: Marland Kitchen Difficulty breathing  . Swelling of face and throat  . A fast heartbeat  . A bad rash all over body  . Dizziness and weakness

## 2019-08-05 ENCOUNTER — Ambulatory Visit: Payer: PRIVATE HEALTH INSURANCE | Attending: Internal Medicine

## 2019-08-05 DIAGNOSIS — Z23 Encounter for immunization: Secondary | ICD-10-CM

## 2019-08-05 NOTE — Progress Notes (Signed)
   Covid-19 Vaccination Clinic  Name:  Carrie Carpenter    MRN: [id number] DOB: 1966-01-17  08/05/2019  [name] was observed post Covid-19 immunization for 15 minutes without incident. She was provided with Vaccine Information Sheet and instruction to access the V-Safe system.   [name] was instructed to call 911 with any severe reactions post vaccine: Marland Kitchen Difficulty breathing  . Swelling of face and throat  . A fast heartbeat  . A bad rash all over body  . Dizziness and weakness   Immunizations Administered    Name Date Dose VIS Date Route   Pfizer COVID-19 Vaccine 08/05/2019  3:21 PM 0.3 mL 06/05/2018 Intramuscular   Manufacturer: [place]   Lot: [id number]   [place]: [id number]

## 2020-01-09 ENCOUNTER — Ambulatory Visit: Payer: PRIVATE HEALTH INSURANCE | Attending: Obstetrics & Gynecology

## 2020-01-09 ENCOUNTER — Other Ambulatory Visit: Payer: Self-pay | Admitting: Obstetrics & Gynecology

## 2020-01-09 DIAGNOSIS — Z1231 Encounter for screening mammogram for malignant neoplasm of breast: Secondary | ICD-10-CM

## 2020-01-16 ENCOUNTER — Other Ambulatory Visit: Payer: Self-pay

## 2020-01-16 ENCOUNTER — Ambulatory Visit
Admission: RE | Admit: 2020-01-16 | Discharge: 2020-01-16 | Disposition: A | Discharge status: Home / Self Care | Payer: BC Managed Care – PPO | Source: Ambulatory Visit | Attending: Obstetrics & Gynecology | Admitting: Obstetrics & Gynecology

## 2020-01-16 DIAGNOSIS — Z1231 Encounter for screening mammogram for malignant neoplasm of breast: Secondary | ICD-10-CM | POA: Diagnosis present

## 2020-01-20 ENCOUNTER — Encounter: Payer: Self-pay | Admitting: Obstetrics & Gynecology

## 2021-01-18 ENCOUNTER — Other Ambulatory Visit: Payer: Self-pay | Admitting: Obstetrics & Gynecology

## 2021-01-18 ENCOUNTER — Other Ambulatory Visit: Payer: Self-pay

## 2021-01-18 ENCOUNTER — Ambulatory Visit
Admission: RE | Admit: 2021-01-18 | Discharge: 2021-01-18 | Disposition: A | Discharge status: Home / Self Care | Payer: BC Managed Care – PPO | Source: Ambulatory Visit | Attending: Obstetrics & Gynecology | Admitting: Obstetrics & Gynecology

## 2021-01-18 DIAGNOSIS — Z1231 Encounter for screening mammogram for malignant neoplasm of breast: Secondary | ICD-10-CM

## 2021-01-22 ENCOUNTER — Encounter: Payer: Self-pay | Admitting: Obstetrics & Gynecology

## 2021-01-26 ENCOUNTER — Telehealth: Payer: Self-pay | Admitting: *Deleted

## 2021-01-26 ENCOUNTER — Other Ambulatory Visit: Payer: Self-pay | Admitting: Obstetrics & Gynecology

## 2021-01-26 DIAGNOSIS — R928 Other abnormal and inconclusive findings on diagnostic imaging of breast: Secondary | ICD-10-CM

## 2021-01-26 DIAGNOSIS — N6489 Other specified disorders of breast: Secondary | ICD-10-CM

## 2021-01-26 NOTE — Telephone Encounter (Signed)
Denise at GI called to inform [place] Poli that this pt needed AV from her screening mammo from the [facility] mobile unit, done on 10/10.  Called pt to schd AV @ [place].  Spoke to pts daughter who stated she will have to call back to schd the AV.

## 2021-02-09 ENCOUNTER — Other Ambulatory Visit: Payer: Self-pay | Admitting: Obstetrics & Gynecology

## 2021-02-09 DIAGNOSIS — R928 Other abnormal and inconclusive findings on diagnostic imaging of breast: Secondary | ICD-10-CM

## 2021-03-01 ENCOUNTER — Other Ambulatory Visit: Payer: PRIVATE HEALTH INSURANCE

## 2021-03-15 ENCOUNTER — Ambulatory Visit
Admission: RE | Admit: 2021-03-15 | Discharge: 2021-03-15 | Disposition: A | Discharge status: Home / Self Care | Payer: BC Managed Care – PPO | Source: Ambulatory Visit | Attending: Obstetrics & Gynecology | Admitting: Obstetrics & Gynecology

## 2021-03-15 ENCOUNTER — Other Ambulatory Visit: Payer: Self-pay | Admitting: Obstetrics & Gynecology

## 2021-03-15 ENCOUNTER — Ambulatory Visit
Admission: RE | Admit: 2021-03-15 | Discharge: 2021-03-15 | Disposition: A | Discharge status: Home / Self Care | Payer: PRIVATE HEALTH INSURANCE | Source: Ambulatory Visit | Attending: Obstetrics & Gynecology | Admitting: Obstetrics & Gynecology

## 2021-03-15 ENCOUNTER — Other Ambulatory Visit: Payer: Self-pay

## 2021-03-15 DIAGNOSIS — R928 Other abnormal and inconclusive findings on diagnostic imaging of breast: Secondary | ICD-10-CM

## 2021-03-21 ENCOUNTER — Encounter: Payer: Self-pay | Admitting: Obstetrics & Gynecology

## 2021-06-30 ENCOUNTER — Emergency Department (HOSPITAL_COMMUNITY)
Admission: EM | Admit: 2021-06-30 | Discharge: 2021-06-30 | Disposition: A | Discharge status: Home / Self Care | Payer: BC Managed Care – PPO | Attending: Emergency Medicine | Admitting: Emergency Medicine

## 2021-06-30 ENCOUNTER — Other Ambulatory Visit: Payer: Self-pay

## 2021-06-30 ENCOUNTER — Emergency Department (HOSPITAL_COMMUNITY): Payer: BC Managed Care – PPO

## 2021-06-30 ENCOUNTER — Encounter (HOSPITAL_COMMUNITY): Payer: Self-pay

## 2021-06-30 DIAGNOSIS — Z79899 Other long term (current) drug therapy: Secondary | ICD-10-CM | POA: Diagnosis not present

## 2021-06-30 DIAGNOSIS — I160 Hypertensive urgency: Secondary | ICD-10-CM | POA: Insufficient documentation

## 2021-06-30 DIAGNOSIS — I10 Essential (primary) hypertension: Secondary | ICD-10-CM | POA: Insufficient documentation

## 2021-06-30 DIAGNOSIS — R519 Headache, unspecified: Secondary | ICD-10-CM | POA: Diagnosis present

## 2021-06-30 LAB — CBC WITH DIFFERENTIAL/PLATELET
Abs Immature Granulocytes: 0.02 10*3/uL (ref 0.00–0.07)
Basophils Absolute: 0.1 10*3/uL (ref 0.0–0.1)
Basophils Relative: 1 %
Eosinophils Absolute: 0.2 10*3/uL (ref 0.0–0.5)
Eosinophils Relative: 2 %
HCT: 45.2 % (ref 36.0–46.0)
Hemoglobin: 14.4 g/dL (ref 12.0–15.0)
Immature Granulocytes: 0 %
Lymphocytes Relative: 32 %
Lymphs Abs: 2.5 10*3/uL (ref 0.7–4.0)
MCH: 27.8 pg (ref 26.0–34.0)
MCHC: 31.9 g/dL (ref 30.0–36.0)
MCV: 87.3 fL (ref 80.0–100.0)
Monocytes Absolute: 0.5 10*3/uL (ref 0.1–1.0)
Monocytes Relative: 6 %
Neutro Abs: 4.5 10*3/uL (ref 1.7–7.7)
Neutrophils Relative %: 59 %
Platelets: 299 10*3/uL (ref 150–400)
RBC: 5.18 MIL/uL — ABNORMAL HIGH (ref 3.87–5.11)
RDW: 13.2 % (ref 11.5–15.5)
WBC: 7.7 10*3/uL (ref 4.0–10.5)
nRBC: 0 % (ref 0.0–0.2)

## 2021-06-30 LAB — COMPREHENSIVE METABOLIC PANEL
ALT: 26 U/L (ref 0–44)
AST: 23 U/L (ref 15–41)
Albumin: 3.9 g/dL (ref 3.5–5.0)
Alkaline Phosphatase: 57 U/L (ref 38–126)
Anion gap: 7 (ref 5–15)
BUN: 9 mg/dL (ref 6–20)
CO2: 25 mmol/L (ref 22–32)
Calcium: 10.6 mg/dL — ABNORMAL HIGH (ref 8.9–10.3)
Chloride: 106 mmol/L (ref 98–111)
Creatinine, Ser: 0.65 mg/dL (ref 0.44–1.00)
GFR, Estimated: >60 mL/min (ref >60)
Glucose, Bld: 85 mg/dL (ref 70–99)
Potassium: 4.4 mmol/L (ref 3.5–5.1)
Sodium: 138 mmol/L (ref 135–145)
Total Bilirubin: 0.6 mg/dL (ref 0.3–1.2)
Total Protein: 7.2 g/dL (ref 6.5–8.1)

## 2021-06-30 LAB — LIPASE, BLOOD: Lipase: 33 U/L (ref 11–51)

## 2021-06-30 MED ORDER — HYDRALAZINE HCL 25 MG PO TABS
25.0000 mg | ORAL_TABLET | Freq: Once | ORAL | Status: AC
Start: 2021-06-30 — End: 2021-06-30
  Administered 2021-06-30: 25 mg via ORAL
  Filled 2021-06-30: qty 1

## 2021-06-30 MED ORDER — DIPHENHYDRAMINE HCL 50 MG/ML IJ SOLN
25.0000 mg | Freq: Once | INTRAMUSCULAR | Status: AC
  Administered 2021-06-30: 25 mg via INTRAVENOUS
  Filled 2021-06-30: qty 1

## 2021-06-30 MED ORDER — KETOROLAC TROMETHAMINE 15 MG/ML IJ SOLN
15.0000 mg | Freq: Once | INTRAMUSCULAR | Status: AC
  Administered 2021-06-30: 15 mg via INTRAVENOUS
  Filled 2021-06-30: qty 1

## 2021-06-30 MED ORDER — PROCHLORPERAZINE EDISYLATE 10 MG/2ML IJ SOLN
10.0000 mg | Freq: Once | INTRAMUSCULAR | Status: DC
  Filled 2021-06-30: qty 2

## 2021-06-30 MED ORDER — PROCHLORPERAZINE EDISYLATE 10 MG/2ML IJ SOLN
10.0000 mg | Freq: Once | INTRAMUSCULAR | Status: AC
  Administered 2021-06-30: 10 mg via INTRAVENOUS

## 2021-06-30 NOTE — ED Notes (Signed)
Discharge instructions and follow up care reviewed and explained, pt verbalized understanding. 

## 2021-06-30 NOTE — ED Provider Notes (Signed)
?[facility] ?Provider Note ? ? ?CSN: [id number] ?Arrival date & time: 06/30/21  1101 ? ?  ? ?History ?Chief Complaint  ?Patient presents with  ? Headache  ? Hypertension  ? ? ?Carrie Carpenter is a 56 y.o. female with history of hypertension on lisinopril who presents the emergency department with a 3-week history of headache and neck pain.  Patient states that she has been caring for her mother over the past 3 weeks and has been "dealing" with the pain.  Her mother recently passed last week and presents the emergency department for evaluation today.  It has been getting worse.  She has not been checking her blood pressure.  She has not missed any doses of her lisinopril.  She denies any chest pain or shortness of breath.  No focal weakness or numbness. ? ? ?Headache ?Hypertension ?Associated symptoms include headaches.  ? ?  ? ?Home Medications ?Prior to Admission medications   ?Medication Sig Start Date End Date Taking? Authorizing Provider  ?acetaminophen (TYLENOL) 325 MG tablet Take 650 mg by mouth daily as needed.   Yes [provider]  ?lisinopril (PRINIVIL,ZESTRIL) 5 MG tablet Take 1 tablet (5 mg total) by mouth daily. 04/25/18  Yes Fransico Meadow, PA-C  ?Pseudoeph-Doxylamine-DM-APAP (NYQUIL PO) Take by mouth. ?Patient not taking: Reported on 06/30/2021    [provider]  ?   ? ?Allergies    ?Patient has no known allergies.   ? ?Review of Systems   ?Review of Systems  ?Neurological:  Positive for headaches.  ?All other systems reviewed and are negative. ? ?Physical Exam ?Updated Vital Signs ?BP (!) 141/79   Pulse 67   Temp 98.1 ?F (36.7 ?C) (Oral)   Resp 16   Ht '5\' 2"'$  (1.575 m)   Wt 56.7 kg   LMP 03/01/2017   SpO2 100%   BMI 22.86 kg/m?  ?Physical Exam ?Vitals and nursing note reviewed.  ?Constitutional:   ?   General: She is not in acute distress. ?   Appearance: Normal appearance.  ?HENT:  ?   Head: Normocephalic and atraumatic.  ?Eyes:  ?   General:      ?   Right eye: No discharge.     ?   Left eye: No discharge.  ?Cardiovascular:  ?   Comments: Regular rate and rhythm.  S1/S2 are distinct without any evidence of murmur, rubs, or gallops.  Radial pulses are 2+ bilaterally.  Dorsalis pedis pulses are 2+ bilaterally.  No evidence of pedal edema. ?Pulmonary:  ?   Comments: Clear to auscultation bilaterally.  Normal effort.  No respiratory distress.  No evidence of wheezes, rales, or rhonchi heard throughout. ?Abdominal:  ?   General: Abdomen is flat. Bowel sounds are normal. There is no distension.  ?   Tenderness: There is no abdominal tenderness. There is no guarding or rebound.  ?Musculoskeletal:     ?   General: Normal range of motion.  ?   Cervical back: Neck supple.  ?Skin: ?   General: Skin is warm and dry.  ?   Findings: No rash.  ?Neurological:  ?   General: No focal deficit present.  ?   Mental Status: She is alert and oriented to person, place, and time.  ?   Cranial Nerves: No cranial nerve deficit, dysarthria or facial asymmetry.  ?   Sensory: Sensation is intact.  ?   Motor: Motor function is intact. No weakness or pronator drift.  ?  Coordination: Coordination is intact. Coordination normal. Finger-Nose-Finger Test normal.  ?Psychiatric:     ?   Mood and Affect: Mood normal.     ?   Behavior: Behavior normal.  ? ? ?ED Results / Procedures / Treatments   ?Labs ?(all labs ordered are listed, but only abnormal results are displayed) ?Labs Reviewed  ?CBC WITH DIFFERENTIAL/PLATELET - Abnormal; Notable for the following components:  ?    Result Value  ? RBC 5.18 (*)   ? All other components within normal limits  ?COMPREHENSIVE METABOLIC PANEL - Abnormal; Notable for the following components:  ? Calcium 10.6 (*)   ? All other components within normal limits  ?LIPASE, BLOOD  ? ? ?EKG ?EKG Interpretation ? ?Date/Time:  Wednesday June 30 2021 12:33:28 EDT ?Ventricular Rate:  64 ?PR Interval:  195 ?QRS Duration: 89 ?QT Interval:  416 ?QTC Calculation: 430 ?R  Axis:   19 ?Text Interpretation: Sinus rhythm Abnormal R-wave progression, early transition Borderline repolarization abnormality No previous tracing Confirmed by Blanchie Dessert [phone]) on 06/30/2021 12:57:24 PM ? ?Radiology ?CT Head Wo Contrast ? ?Result Date: 06/30/2021 ?CLINICAL DATA:  Headache and hypertension. EXAM: CT HEAD WITHOUT CONTRAST TECHNIQUE: Contiguous axial images were obtained from the base of the skull through the vertex without intravenous contrast. RADIATION DOSE REDUCTION: This exam was performed according to the departmental dose-optimization program which includes automated exposure control, adjustment of the mA and/or kV according to patient size and/or use of iterative reconstruction technique. COMPARISON:  CT head dated May 31, 2008. FINDINGS: Brain: No evidence of acute infarction, hemorrhage, hydrocephalus, extra-axial collection or mass lesion/mass effect. Vascular: No hyperdense vessel or unexpected calcification. Skull: Normal. Negative for fracture or focal lesion. Sinuses/Orbits: No acute finding. Other: None. IMPRESSION: 1. No acute intracranial abnormality. Electronically Signed   By: Titus Dubin M.D.   On: 06/30/2021 13:01   ? ?Procedures ?Procedures  ? ? ?Medications Ordered in ED ?Medications  ?hydrALAZINE (APRESOLINE) tablet 25 mg (25 mg Oral Given 06/30/21 1137)  ?prochlorperazine (COMPAZINE) injection 10 mg (10 mg Intravenous Given 06/30/21 1311)  ?diphenhydrAMINE (BENADRYL) injection 25 mg (25 mg Intravenous Given 06/30/21 1436)  ?ketorolac (TORADOL) 15 MG/ML injection 15 mg (15 mg Intravenous Given 06/30/21 1436)  ? ? ?ED Course/ Medical Decision Making/ A&P ?Clinical Course as of 06/30/21 1506  ?Wed Jun 30, 2021  ?1349 On reevaluation, blood pressure is significantly improving.  Most recent one was in the 149F systolic which is down from 215.  Patient still having no chest pain or shortness of breath.  Still complaining of headache.  We will add on Benadryl and  Toradol to headache cocktail. [CF]  ?  ?Clinical Course User Index ?[CF] Myna Bright M, PA-C  ? ?                        ?Medical Decision Making ?Amount and/or Complexity of Data Reviewed ?Labs: ordered. ? ?Risk ?Prescription drug management. ? ? ?This patient presents to the ED for concern of headache and neck pain, this involves an extensive number of treatment options, and is a complaint that carries with it a high risk of complications and morbidity.  The differential diagnosis includes hypertensive urgency, migraine.  This been going on for 3 weeks and have a low suspicion for meningitis at this time or any other infectious etiology.  She has not had any fever.  This could be stress-induced given her history helping at her mother of the last 3 weeks.  Patient's belly is nontender I have a low suspicion for emergent abdominal pathology at this time. ? ?Co morbidities that complicate the patient evaluation ? ?Past Medical History:  ?Diagnosis Date  ? Allergy   ? GERD (gastroesophageal reflux disease)   ? Headache   ? migraines  ? Hypertension   ? ? ?Additional history obtained: ? ?Additional history obtained from nursing note ? ? ?Lab Tests: ? ?I Ordered, and personally interpreted labs.  The pertinent results include:   ? ? ?Imaging Studies ordered: ? ?I ordered imaging studies including CT head ?I independently visualized and interpreted imaging which showed no evidence of intraparenchymal hemorrhage or space-occupying lesion ?I agree with the radiologist interpretation ? ? ?Cardiac Monitoring: ? ?The patient was maintained on a cardiac monitor.  I personally viewed and interpreted the cardiac monitored which showed an underlying rhythm of: Normal sinus rhythm ? ? ?Medicines ordered and prescription drug management: ? ?I ordered medication including hydralazine for hypertension, Compazine, Toradol, and Benadryl for headache. ?Reevaluation of the patient after these medicines showed that the patient  improved ?I have reviewed the patients home medicines and have made adjustments as needed ? ? ?Problem List / ED Course: ? ?Headache and neck pain likely secondary to elevated blood pressure.  Patient was on a low-dose

## 2021-06-30 NOTE — ED Notes (Signed)
Patient transported to CT 

## 2021-06-30 NOTE — Discharge Instructions (Addendum)
Please follow-up with your primary care provider for further evaluation and medication adjustment.  ?

## 2021-06-30 NOTE — ED Provider Triage Note (Signed)
Emergency Medicine Provider Triage Evaluation Note ? ?Carrie Carpenter , a 56 y.o. female  was evaluated in triage.  Pt complains of severe headache.  She has high blood pressure and it has not been controlled with her at home medications.  Was seen by urgent care and they sent her to the emergency department due to headache, visual disturbance and elevated blood pressure ? ?Review of Systems  ?Positive: As above ?Negative: Nausea, vomiting or chest pain, fevers ? ?Physical Exam  ?BP (!) 200/99 (BP Location: Left Arm)   Pulse 62   Resp 20   Ht '5\' 2"'$  (1.575 m)   Wt 56.7 kg   LMP 03/01/2017   SpO2 100%   BMI 22.86 kg/m?  ?Gen:   Awake, no distress   ?Resp:  Normal effort  ?MSK:   Moves extremities without difficulty  ?Other:  5 out of 5 strength, PERRLA ? ?Medical Decision Making  ?Medically screening exam initiated at 11:33 AM.  Appropriate orders placed.  Carrie Carpenter was informed that the remainder of the evaluation will be completed by another provider, this initial triage assessment does not replace that evaluation, and the importance of remaining in the ED until their evaluation is complete. ? ? ? ?Daughter used as Astronomer.  CT ordered as well as hydralazine for potential hypertensive emergency ?  ?Rhae Hammock, PA-C ?06/30/21 1136 ? ?

## 2021-06-30 NOTE — ED Triage Notes (Signed)
Pt arrived POV from home c/o hypertension, headache and nausea. Pt states her Bp has been high for 3 weeks so has been taking meds that are not working. Pt was seen by PCP and told to come here. ?

## 2021-07-20 ENCOUNTER — Other Ambulatory Visit: Payer: Self-pay | Admitting: Gastroenterology

## 2021-07-20 DIAGNOSIS — R1011 Right upper quadrant pain: Secondary | ICD-10-CM

## 2021-07-26 ENCOUNTER — Ambulatory Visit
Admission: RE | Admit: 2021-07-26 | Discharge: 2021-07-26 | Disposition: A | Discharge status: Home / Self Care | Payer: BC Managed Care – PPO | Source: Ambulatory Visit | Attending: Gastroenterology | Admitting: Gastroenterology

## 2021-07-26 DIAGNOSIS — R1011 Right upper quadrant pain: Secondary | ICD-10-CM

## 2021-09-14 ENCOUNTER — Other Ambulatory Visit: Payer: BC Managed Care – PPO

## 2022-03-11 ENCOUNTER — Other Ambulatory Visit: Payer: Self-pay | Admitting: Obstetrics & Gynecology

## 2022-03-11 DIAGNOSIS — R928 Other abnormal and inconclusive findings on diagnostic imaging of breast: Secondary | ICD-10-CM

## 2022-03-22 ENCOUNTER — Other Ambulatory Visit: Payer: Self-pay | Admitting: Obstetrics & Gynecology

## 2022-03-22 DIAGNOSIS — N6489 Other specified disorders of breast: Secondary | ICD-10-CM

## 2022-03-25 ENCOUNTER — Other Ambulatory Visit: Payer: BC Managed Care – PPO

## 2022-04-18 ENCOUNTER — Ambulatory Visit
Admission: RE | Admit: 2022-04-18 | Discharge: 2022-04-18 | Disposition: A | Discharge status: Home / Self Care | Payer: BC Managed Care – PPO | Source: Ambulatory Visit | Attending: Obstetrics & Gynecology | Admitting: Obstetrics & Gynecology

## 2022-04-18 ENCOUNTER — Ambulatory Visit: Admission: RE | Admit: 2022-04-18 | Payer: BC Managed Care – PPO | Source: Ambulatory Visit

## 2022-04-18 DIAGNOSIS — N6489 Other specified disorders of breast: Secondary | ICD-10-CM

## 2023-03-28 ENCOUNTER — Other Ambulatory Visit: Payer: Self-pay | Admitting: Otolaryngology

## 2023-04-04 ENCOUNTER — Encounter (HOSPITAL_COMMUNITY): Payer: Self-pay | Admitting: Otolaryngology

## 2023-04-06 ENCOUNTER — Encounter (HOSPITAL_COMMUNITY)
Admission: RE | Disposition: A | Discharge status: Home / Self Care | Payer: Self-pay | Source: Home / Self Care | Attending: Otolaryngology

## 2023-04-06 ENCOUNTER — Ambulatory Visit (HOSPITAL_COMMUNITY): Payer: BC Managed Care – PPO | Admitting: Anesthesiology

## 2023-04-06 ENCOUNTER — Encounter (HOSPITAL_COMMUNITY): Payer: Self-pay | Admitting: Otolaryngology

## 2023-04-06 ENCOUNTER — Other Ambulatory Visit: Payer: Self-pay

## 2023-04-06 ENCOUNTER — Ambulatory Visit (HOSPITAL_COMMUNITY)
Admission: RE | Admit: 2023-04-06 | Discharge: 2023-04-06 | Disposition: A | Discharge status: Home / Self Care | Payer: BC Managed Care – PPO | Attending: Otolaryngology | Admitting: Otolaryngology

## 2023-04-06 DIAGNOSIS — I10 Essential (primary) hypertension: Secondary | ICD-10-CM | POA: Diagnosis not present

## 2023-04-06 DIAGNOSIS — K219 Gastro-esophageal reflux disease without esophagitis: Secondary | ICD-10-CM | POA: Insufficient documentation

## 2023-04-06 DIAGNOSIS — Z79899 Other long term (current) drug therapy: Secondary | ICD-10-CM | POA: Diagnosis not present

## 2023-04-06 DIAGNOSIS — E213 Hyperparathyroidism, unspecified: Secondary | ICD-10-CM | POA: Insufficient documentation

## 2023-04-06 DIAGNOSIS — D351 Benign neoplasm of parathyroid gland: Secondary | ICD-10-CM | POA: Insufficient documentation

## 2023-04-06 DIAGNOSIS — Z01818 Encounter for other preprocedural examination: Secondary | ICD-10-CM

## 2023-04-06 HISTORY — PX: PARATHYROIDECTOMY: SHX19

## 2023-04-06 LAB — BASIC METABOLIC PANEL
Anion gap: 8 (ref 5–15)
BUN: 11 mg/dL (ref 6–20)
CO2: 25 mmol/L (ref 22–32)
Calcium: 11.7 mg/dL — ABNORMAL HIGH (ref 8.9–10.3)
Chloride: 106 mmol/L (ref 98–111)
Creatinine, Ser: 0.57 mg/dL (ref 0.44–1.00)
GFR, Estimated: >60 mL/min (ref >60)
Glucose, Bld: 77 mg/dL (ref 70–99)
Potassium: 4.5 mmol/L (ref 3.5–5.1)
Sodium: 139 mmol/L (ref 135–145)

## 2023-04-06 LAB — CBC
HCT: 45.3 % (ref 36.0–46.0)
Hemoglobin: 14.5 g/dL (ref 12.0–15.0)
MCH: 27.4 pg (ref 26.0–34.0)
MCHC: 32 g/dL (ref 30.0–36.0)
MCV: 85.6 fL (ref 80.0–100.0)
Platelets: 241 10*3/uL (ref 150–400)
RBC: 5.29 MIL/uL — ABNORMAL HIGH (ref 3.87–5.11)
RDW: 13.9 % (ref 11.5–15.5)
WBC: 6.3 10*3/uL (ref 4.0–10.5)
nRBC: 0 % (ref 0.0–0.2)

## 2023-04-06 SURGERY — PARATHYROIDECTOMY
Anesthesia: General | Site: Neck | Laterality: Left

## 2023-04-06 MED ORDER — ACETAMINOPHEN 500 MG PO TABS
1000.0000 mg | ORAL_TABLET | Freq: Once | ORAL | Status: AC
  Administered 2023-04-06: 1000 mg via ORAL
  Filled 2023-04-06: qty 2

## 2023-04-06 MED ORDER — LIDOCAINE HCL (PF) 2 % IJ SOLN
INTRAMUSCULAR | Status: DC | PRN
  Administered 2023-04-06: 100 mg via INTRADERMAL

## 2023-04-06 MED ORDER — HYDROCODONE-ACETAMINOPHEN 5-325 MG PO TABS: 1.0000 | ORAL_TABLET | Freq: Four times a day (QID) | ORAL | 0 refills | Status: AC | PRN

## 2023-04-06 MED ORDER — FENTANYL CITRATE (PF) 250 MCG/5ML IJ SOLN
INTRAMUSCULAR | Status: DC | PRN
  Administered 2023-04-06: 50 ug via INTRAVENOUS

## 2023-04-06 MED ORDER — PHENYLEPHRINE HCL-NACL 20-0.9 MG/250ML-% IV SOLN
INTRAVENOUS | Status: DC | PRN
  Administered 2023-04-06: 40 ug/min via INTRAVENOUS

## 2023-04-06 MED ORDER — ACETAMINOPHEN 325 MG PO TABS: 325.0000 mg | ORAL_TABLET | ORAL | Status: DC | PRN

## 2023-04-06 MED ORDER — DEXAMETHASONE SODIUM PHOSPHATE 10 MG/ML IJ SOLN
INTRAMUSCULAR | Status: DC | PRN
  Administered 2023-04-06: 10 mg via INTRAVENOUS

## 2023-04-06 MED ORDER — CELECOXIB 200 MG PO CAPS
200.0000 mg | ORAL_CAPSULE | Freq: Once | ORAL | Status: AC
  Administered 2023-04-06: 200 mg via ORAL
  Filled 2023-04-06: qty 1

## 2023-04-06 MED ORDER — DROPERIDOL 2.5 MG/ML IJ SOLN
0.6250 mg | Freq: Once | INTRAMUSCULAR | Status: AC
  Administered 2023-04-06: 0.625 mg via INTRAVENOUS

## 2023-04-06 MED ORDER — PHENYLEPHRINE HCL-NACL 20-0.9 MG/250ML-% IV SOLN
INTRAVENOUS | Status: AC
  Filled 2023-04-06: qty 250

## 2023-04-06 MED ORDER — PROPOFOL 10 MG/ML IV BOLUS
INTRAVENOUS | Status: AC
  Filled 2023-04-06: qty 20

## 2023-04-06 MED ORDER — LACTATED RINGERS IV SOLN: INTRAVENOUS | Status: DC

## 2023-04-06 MED ORDER — SUCCINYLCHOLINE CHLORIDE 200 MG/10ML IV SOSY
PREFILLED_SYRINGE | INTRAVENOUS | Status: AC
  Filled 2023-04-06: qty 10

## 2023-04-06 MED ORDER — FENTANYL CITRATE (PF) 100 MCG/2ML IJ SOLN
25.0000 ug | INTRAMUSCULAR | Status: DC | PRN
  Administered 2023-04-06 (×2): 25 ug via INTRAVENOUS
  Administered 2023-04-06: 50 ug via INTRAVENOUS

## 2023-04-06 MED ORDER — SUCCINYLCHOLINE CHLORIDE 200 MG/10ML IV SOSY
PREFILLED_SYRINGE | INTRAVENOUS | Status: DC | PRN
  Administered 2023-04-06: 120 mg via INTRAVENOUS

## 2023-04-06 MED ORDER — 0.9 % SODIUM CHLORIDE (POUR BTL) OPTIME
TOPICAL | Status: DC | PRN
  Administered 2023-04-06: 1000 mL

## 2023-04-06 MED ORDER — DEXAMETHASONE SODIUM PHOSPHATE 10 MG/ML IJ SOLN
INTRAMUSCULAR | Status: AC
  Filled 2023-04-06: qty 1

## 2023-04-06 MED ORDER — CHLORHEXIDINE GLUCONATE 0.12 % MT SOLN
OROMUCOSAL | Status: AC
  Administered 2023-04-06: 15 mL via OROMUCOSAL
  Filled 2023-04-06: qty 15

## 2023-04-06 MED ORDER — LIDOCAINE-EPINEPHRINE 1 %-1:100000 IJ SOLN
INTRAMUSCULAR | Status: DC | PRN
  Administered 2023-04-06: 2.5 mL

## 2023-04-06 MED ORDER — PROPOFOL 10 MG/ML IV BOLUS
INTRAVENOUS | Status: DC | PRN
  Administered 2023-04-06: 50 mg via INTRAVENOUS
  Administered 2023-04-06: 70 mg via INTRAVENOUS

## 2023-04-06 MED ORDER — DROPERIDOL 2.5 MG/ML IJ SOLN
INTRAMUSCULAR | Status: AC
  Filled 2023-04-06: qty 2

## 2023-04-06 MED ORDER — ONDANSETRON HCL 4 MG/2ML IJ SOLN
INTRAMUSCULAR | Status: AC
  Filled 2023-04-06: qty 2

## 2023-04-06 MED ORDER — FENTANYL CITRATE (PF) 250 MCG/5ML IJ SOLN
INTRAMUSCULAR | Status: AC
  Filled 2023-04-06: qty 5

## 2023-04-06 MED ORDER — OXYCODONE HCL 5 MG/5ML PO SOLN: 5.0000 mg | Freq: Once | ORAL | Status: AC | PRN

## 2023-04-06 MED ORDER — HEMOSTATIC AGENTS (NO CHARGE) OPTIME
TOPICAL | Status: DC | PRN
  Administered 2023-04-06: 1 via TOPICAL

## 2023-04-06 MED ORDER — MIDAZOLAM HCL 2 MG/2ML IJ SOLN
INTRAMUSCULAR | Status: DC | PRN
  Administered 2023-04-06: 2 mg via INTRAVENOUS

## 2023-04-06 MED ORDER — CHLORHEXIDINE GLUCONATE 0.12 % MT SOLN: 15.0000 mL | Freq: Once | OROMUCOSAL | Status: AC

## 2023-04-06 MED ORDER — ONDANSETRON HCL 4 MG/2ML IJ SOLN
INTRAMUSCULAR | Status: DC | PRN
  Administered 2023-04-06: 4 mg via INTRAVENOUS

## 2023-04-06 MED ORDER — KETAMINE HCL 50 MG/5ML IJ SOSY
PREFILLED_SYRINGE | INTRAMUSCULAR | Status: AC
  Filled 2023-04-06: qty 5

## 2023-04-06 MED ORDER — ORAL CARE MOUTH RINSE: 15.0000 mL | Freq: Once | OROMUCOSAL | Status: AC

## 2023-04-06 MED ORDER — OXYCODONE HCL 5 MG PO TABS
ORAL_TABLET | ORAL | Status: AC
  Filled 2023-04-06: qty 1

## 2023-04-06 MED ORDER — ACETAMINOPHEN 160 MG/5ML PO SOLN: 325.0000 mg | ORAL | Status: DC | PRN

## 2023-04-06 MED ORDER — MEPERIDINE HCL 25 MG/ML IJ SOLN: 6.2500 mg | INTRAMUSCULAR | Status: DC | PRN

## 2023-04-06 MED ORDER — FENTANYL CITRATE (PF) 100 MCG/2ML IJ SOLN
INTRAMUSCULAR | Status: AC
  Filled 2023-04-06: qty 2

## 2023-04-06 MED ORDER — ONDANSETRON HCL 4 MG/2ML IJ SOLN: 4.0000 mg | Freq: Once | INTRAMUSCULAR | Status: DC | PRN

## 2023-04-06 MED ORDER — CEFAZOLIN SODIUM-DEXTROSE 2-4 GM/100ML-% IV SOLN
INTRAVENOUS | Status: AC
  Filled 2023-04-06: qty 100

## 2023-04-06 MED ORDER — CEFAZOLIN SODIUM-DEXTROSE 2-4 GM/100ML-% IV SOLN
2.0000 g | INTRAVENOUS | Status: AC
  Administered 2023-04-06: 2 g via INTRAVENOUS

## 2023-04-06 MED ORDER — OXYCODONE HCL 5 MG PO TABS
5.0000 mg | ORAL_TABLET | Freq: Once | ORAL | Status: AC | PRN
  Administered 2023-04-06: 5 mg via ORAL

## 2023-04-06 MED ORDER — LIDOCAINE-EPINEPHRINE 1 %-1:100000 IJ SOLN
INTRAMUSCULAR | Status: AC
  Filled 2023-04-06: qty 1

## 2023-04-06 MED ORDER — LIDOCAINE 2% (20 MG/ML) 5 ML SYRINGE
INTRAMUSCULAR | Status: AC
  Filled 2023-04-06: qty 5

## 2023-04-06 MED ORDER — MIDAZOLAM HCL 2 MG/2ML IJ SOLN
INTRAMUSCULAR | Status: AC
  Filled 2023-04-06: qty 2

## 2023-04-06 SURGICAL SUPPLY — 39 items
BAG COUNTER SPONGE SURGICOUNT (BAG) ×1 IMPLANT
BLADE SURG 15 STRL LF DISP TIS (BLADE) IMPLANT
CANISTER SUCT 3000ML PPV (MISCELLANEOUS) ×1 IMPLANT
CLEANER TIP ELECTROSURG 2X2 (MISCELLANEOUS) ×1 IMPLANT
CNTNR URN SCR LID CUP LEK RST (MISCELLANEOUS) IMPLANT
CORD BIPOLAR FORCEPS 12FT (ELECTRODE) ×1 IMPLANT
COVER SURGICAL LIGHT HANDLE (MISCELLANEOUS) ×1 IMPLANT
DERMABOND ADVANCED .7 DNX12 (GAUZE/BANDAGES/DRESSINGS) ×1 IMPLANT
DRAIN JACKSON RD 7FR 3/32 (WOUND CARE) IMPLANT
DRAIN JP 10F RND RADIO (DRAIN) IMPLANT
DRAPE HALF SHEET 40X57 (DRAPES) IMPLANT
ELECT COATED BLADE 2.86 ST (ELECTRODE) ×1 IMPLANT
ELECT REM PT RETURN 9FT ADLT (ELECTROSURGICAL) ×1
ELECTRODE REM PT RTRN 9FT ADLT (ELECTROSURGICAL) ×1 IMPLANT
EVACUATOR SILICONE 100CC (DRAIN) ×1 IMPLANT
FORCEPS BIPOLAR SPETZLER 8 1.0 (NEUROSURGERY SUPPLIES) ×1 IMPLANT
GAUZE 4X4 16PLY ~~LOC~~+RFID DBL (SPONGE) ×1 IMPLANT
GLOVE BIO SURGEON STRL SZ7.5 (GLOVE) ×1 IMPLANT
GOWN STRL REUS W/ TWL LRG LVL3 (GOWN DISPOSABLE) ×2 IMPLANT
HEMOSTAT SURGICEL .5X2 ABSORB (HEMOSTASIS) IMPLANT
KIT BASIN OR (CUSTOM PROCEDURE TRAY) ×1 IMPLANT
KIT TURNOVER KIT B (KITS) ×1 IMPLANT
LOCATOR NERVE 3 VOLT (DISPOSABLE) IMPLANT
NDL HYPO 25GX1X1/2 BEV (NEEDLE) ×1 IMPLANT
NEEDLE HYPO 25GX1X1/2 BEV (NEEDLE) ×1 IMPLANT
NS IRRIG 1000ML POUR BTL (IV SOLUTION) ×1 IMPLANT
PAD ARMBOARD 7.5X6 YLW CONV (MISCELLANEOUS) ×2 IMPLANT
PENCIL SMOKE EVACUATOR (MISCELLANEOUS) ×1 IMPLANT
POSITIONER HEAD DONUT 9IN (MISCELLANEOUS) IMPLANT
SHEARS HARMONIC 9CM CVD (BLADE) ×1 IMPLANT
SPONGE INTESTINAL PEANUT (DISPOSABLE) IMPLANT
STAPLER VISISTAT 35W (STAPLE) ×1 IMPLANT
SUT ETHILON 2 0 FS 18 (SUTURE) ×1 IMPLANT
SUT SILK 3 0 REEL (SUTURE) ×1 IMPLANT
SUT VIC AB 3-0 SH 27X BRD (SUTURE) ×1 IMPLANT
SUT VICRYL 4-0 PS2 18IN ABS (SUTURE) ×1 IMPLANT
TOWEL GREEN STERILE FF (TOWEL DISPOSABLE) ×1 IMPLANT
TRAY ENT MC OR (CUSTOM PROCEDURE TRAY) ×1 IMPLANT
TUBE 10FR 43IN 5G WT TIP ENFIT (TUBING) IMPLANT

## 2023-04-06 NOTE — Anesthesia Preprocedure Evaluation (Addendum)
Anesthesia Evaluation  Patient identified by MRN, date of birth, ID band Patient awake    Reviewed: Allergy & Precautions, H&P , NPO status , Patient's Chart, lab work & pertinent test results  History of Anesthesia Complications Negative for: history of anesthetic complications  Airway Mallampati: III  TM Distance: >3 FB Neck ROM: Full    Dental no notable dental hx. (+) Teeth Intact, Dental Advisory Given, Missing,    Pulmonary neg pulmonary ROS, neg shortness of breath   Pulmonary exam normal breath sounds clear to auscultation       Cardiovascular Exercise Tolerance: Good hypertension, Pt. on medications (-) angina (-) Past MI and (-) DOE Normal cardiovascular exam Rhythm:Regular Rate:Normal     Neuro/Psych  Headaches  negative psych ROS   GI/Hepatic Neg liver ROS,GERD  Medicated and Controlled,,  Endo/Other  negative endocrine ROS    Renal/GU      Musculoskeletal   Abdominal   Peds  Hematology negative hematology ROS (+)   Anesthesia Other Findings    Reproductive/Obstetrics negative OB ROS                             Anesthesia Physical Anesthesia Plan  ASA: 3  Anesthesia Plan: General ETT and General   Post-op Pain Management: Minimal or no pain anticipated, Tylenol PO (pre-op)* and Celebrex PO (pre-op)*   Induction: Intravenous  PONV Risk Score and Plan: 3 and Ondansetron, Dexamethasone and Midazolam  Airway Management Planned: Oral ETT  Additional Equipment: None  Intra-op Plan:   Post-operative Plan: Extubation in OR  Informed Consent: I have reviewed the patients History and Physical, chart, labs and discussed the procedure including the risks, benefits and alternatives for the proposed anesthesia with the patient or authorized representative who has indicated his/her understanding and acceptance.     Dental Advisory Given  Plan Discussed with:  Anesthesiologist, CRNA and Surgeon  Anesthesia Plan Comments:         Anesthesia Quick Evaluation

## 2023-04-06 NOTE — Transfer of Care (Signed)
Immediate Anesthesia Transfer of Care Note  Patient: Carrie Carpenter  Procedure(s) Performed: LEFT PARATHYROIDECTOMY (Left: Neck)  Patient Location: PACU  Anesthesia Type:General  Level of Consciousness: sedated  Airway & Oxygen Therapy: Patient Spontanous Breathing  Post-op Assessment: Report given to RN and Post -op Vital signs reviewed and stable  Post vital signs: Reviewed and stable  Last Vitals:  Vitals Value Taken Time  BP 159/94 04/06/23 1610  Temp    Pulse 68 04/06/23 1613  Resp 21 04/06/23 1613  SpO2 95 % 04/06/23 1613  Vitals shown include unfiled device data.  Last Pain:  Vitals:   04/06/23 1307  PainSc: 0-No pain         Complications: No notable events documented.

## 2023-04-06 NOTE — Progress Notes (Signed)
Pt called to inquire her where abouts, as she should have arrive at 1200. Pt sts she is in the parking lot and will be in shortly.

## 2023-04-06 NOTE — H&P (Signed)
Carrie Carpenter is an 57 y.o. female.   Chief Complaint: Hypercalcemia HPI: 57 year old female found to be hypercalcemic due to hyperparathyroidism localized by ultrasound to a left superior pole adenoma.  Past Medical History:  Diagnosis Date   Allergy    GERD (gastroesophageal reflux disease)    Headache    migraines   Hypertension     Past Surgical History:  Procedure Laterality Date   ABDOMINAL HYSTERECTOMY  04/06/2017   CYSTOSCOPY  04/06/2017   Procedure: CYSTOSCOPY;  Surgeon: Nadara Mustard, MD;  Location: [facility] ORS;  Service: Gynecology;;   LAPAROSCOPIC HYSTERECTOMY Bilateral 04/06/2017   Procedure: HYSTERECTOMY TOTAL LAPAROSCOPIC BILATERAL SALPINGECTOMY;  Surgeon: Nadara Mustard, MD;  Location: [facility] ORS;  Service: Gynecology;  Laterality: Bilateral;   TUBAL LIGATION      Family History  Problem Relation Age of Onset   Healthy Daughter    Healthy Daughter    Healthy Mother    Healthy Brother    Healthy Son    Breast cancer Neg Hx    Social History:  reports that she has never smoked. She has never used smokeless tobacco. She reports that she does not drink alcohol and does not use drugs.  Allergies: No Known Allergies  Medications Prior to Admission  Medication Sig Dispense Refill   acetaminophen (TYLENOL) 325 MG tablet Take 650 mg by mouth daily as needed.     lisinopril (PRINIVIL,ZESTRIL) 5 MG tablet Take 1 tablet (5 mg total) by mouth daily. 30 tablet 1   Pseudoeph-Doxylamine-DM-APAP (NYQUIL PO) Take by mouth. (Patient not taking: Reported on 06/30/2021)      Results for orders placed or performed during the hospital encounter of 04/06/23 (from the past 48 hours)  Basic metabolic panel per protocol     Status: Abnormal   Collection Time: 04/06/23 12:37 PM  Result Value Ref Range   Sodium 139 135 - 145 mmol/L   Potassium 4.5 3.5 - 5.1 mmol/L    Comment: HEMOLYSIS AT THIS LEVEL MAY AFFECT RESULT   Chloride 106 98 - 111 mmol/L   CO2 25 22 - 32 mmol/L   Glucose,  Bld 77 70 - 99 mg/dL    Comment: Glucose reference range applies only to samples taken after fasting for at least 8 hours.   BUN 11 6 - 20 mg/dL   Creatinine, Ser 9.14 0.44 - 1.00 mg/dL   Calcium 78.2 (H) 8.9 - 10.3 mg/dL   GFR, Estimated >95 >62 mL/min    Comment: (NOTE) Calculated using the CKD-EPI Creatinine Equation (2021)    Anion gap 8 5 - 15    Comment: Performed at [facility], [address], [place], [place] [address]  CBC per protocol     Status: Abnormal   Collection Time: 04/06/23 12:37 PM  Result Value Ref Range   WBC 6.3 4.0 - 10.5 K/uL   RBC 5.29 (H) 3.87 - 5.11 MIL/uL   Hemoglobin 14.5 12.0 - 15.0 g/dL   HCT 57.8 46.9 - 62.9 %   MCV 85.6 80.0 - 100.0 fL   MCH 27.4 26.0 - 34.0 pg   MCHC 32.0 30.0 - 36.0 g/dL   RDW 52.8 41.3 - 24.4 %   Platelets 241 150 - 400 K/uL   nRBC 0.0 0.0 - 0.2 %    Comment: Performed at [facility], [address] [place], [place] [address]   No results found.  Review of Systems  All other systems reviewed and are  negative.   Blood pressure (!) 138/92, pulse 69, temperature 98.7 F (37.1 C), resp. rate 16, height 5\' 2"  (1.575 m), weight 54.4 kg, last menstrual period 03/01/2017, SpO2 99%. Physical Exam Constitutional:      Appearance: Normal appearance. She is normal weight.  HENT:     Head: Normocephalic and atraumatic.     Right Ear: External ear normal.     Left Ear: External ear normal.     Nose: Nose normal.     Mouth/Throat:     Mouth: Mucous membranes are moist.     Pharynx: Oropharynx is clear.  Eyes:     Extraocular Movements: Extraocular movements intact.     Conjunctiva/sclera: Conjunctivae normal.     Pupils: Pupils are equal, round, and reactive to light.  Cardiovascular:     Rate and Rhythm: Normal rate.  Pulmonary:     Effort: Pulmonary effort is normal.  Musculoskeletal:     Cervical back: Normal range of motion.  Skin:    General: Skin is warm and dry.  Neurological:     General: No focal  deficit present.     Mental Status: She is alert and oriented to person, place, and time.  Psychiatric:        Mood and Affect: Mood normal.        Behavior: Behavior normal.        Thought Content: Thought content normal.        Judgment: Judgment normal.      Assessment/Plan Hyperparathyroidism  To OR for left superior parathyroidectomy.  Christia Reading, MD 04/06/2023, 2:16 PM

## 2023-04-06 NOTE — Brief Op Note (Signed)
04/06/2023  4:06 PM  PATIENT:  Carrie Carpenter  57 y.o. female  PRE-OPERATIVE DIAGNOSIS:  Hyperparathyroidism  POST-OPERATIVE DIAGNOSIS:  Hyperparathyroidism  PROCEDURE:  Procedure(s): LEFT PARATHYROIDECTOMY (Left)  SURGEON:  Surgeons and Role:    Christia Reading, MD - Primary  PHYSICIAN ASSISTANT:   ASSISTANTS: RNFA   ANESTHESIA:   general  EBL:  Minimal   BLOOD ADMINISTERED:none  DRAINS: none   LOCAL MEDICATIONS USED:  LIDOCAINE   SPECIMEN:  Source of Specimen:  Left superior parathyroid gland  DISPOSITION OF SPECIMEN:  PATHOLOGY  COUNTS:  YES  TOURNIQUET:  * No tourniquets in log *  DICTATION: .Note written in EPIC  PLAN OF CARE: Discharge to home after PACU  PATIENT DISPOSITION:  PACU - hemodynamically stable.   Delay start of Pharmacological VTE agent (>24hrs) due to surgical blood loss or risk of bleeding: no

## 2023-04-06 NOTE — Op Note (Signed)
Preop diagnosis: Hyperparathyroidism, parathyroid adenoma Postop diagnosis: same Procedure: Left superior parathyroidectomy Surgeon: Jenne Pane Assist: RNFA Anesth: General and local with 1% lidocaine with 1:100,000 epinephrine Compl: None Findings: Enlarged parathyroid mass on posterior thyroid surface about mid-gland. Description:  After discussing risks, benefits, and alternatives, the patient was brought to the operative suite and placed on the operative table in the supine position.  Anesthesia was induced and the patient was intubated by the anesthesia team without difficulty using a NIM tube.  The eyes were taped closed.  The incision was marked with a marking pen and injected with local anesthetic.  The neck was prepped and draped in sterile fashion.  The incision was made with a 15 blade scalpel and extended through the subcutaneous and platysma layers with Bovie electrocautery.  The midline raphe of the strap muscles was divided and strap muscles retracted over the left thyroid lobe.  Dissection was then performed around the lobe, visualizing the carotid artery.  On the deep surface of the thyroid, a separate mass was identified and carefully dissected from surrounding tissues with limited cautery.  The tissue was removed and passed to nursing for frozen section pathology.  The result returned as parathyroid tissue.  The wound was irrigated with saline.  There was no significant bleeding with a Valsalva.  A piece of Surgicel fabric was placed in the depth of the wound.  The platysma layer was closed with 3-0 Vicryl in a simple, interrupted fashion.  The subcutaneous layer was similarly closed with 4-0 Vicryl and the skin was closed with glue.  Drapes were removed and the patient was cleaned off.  She was returned to anesthesia for wake-up, was extubated, and moved to the recovery room in stable condition.

## 2023-04-06 NOTE — Anesthesia Procedure Notes (Signed)
Procedure Name: Intubation Date/Time: 04/06/2023 2:44 PM  Performed by: Susy Manor, CRNAPre-anesthesia Checklist: Patient identified, Emergency Drugs available, Suction available and Patient being monitored Patient Re-evaluated:Patient Re-evaluated prior to induction Oxygen Delivery Method: Circle System Utilized Preoxygenation: Pre-oxygenation with 100% oxygen Induction Type: IV induction Ventilation: Mask ventilation without difficulty Laryngoscope Size: Glidescope and 3 Grade View: Grade I Tube type: Oral Number of attempts: 1 Airway Equipment and Method: Stylet and Oral airway Placement Confirmation: ETT inserted through vocal cords under direct vision, positive ETCO2 and breath sounds checked- equal and bilateral Secured at: 22 cm Tube secured with: Tape Dental Injury: Teeth and Oropharynx as per pre-operative assessment

## 2023-04-07 ENCOUNTER — Encounter (HOSPITAL_COMMUNITY): Payer: Self-pay | Admitting: Otolaryngology

## 2023-04-07 LAB — SURGICAL PATHOLOGY

## 2023-04-10 NOTE — Anesthesia Postprocedure Evaluation (Signed)
Anesthesia Post Note  Patient: Carrie Carpenter  Procedure(s) Performed: LEFT PARATHYROIDECTOMY (Left: Neck)     Patient location during evaluation: PACU Anesthesia Type: General Level of consciousness: awake and alert Pain management: pain level controlled Vital Signs Assessment: post-procedure vital signs reviewed and stable Respiratory status: spontaneous breathing, nonlabored ventilation, respiratory function stable and patient connected to nasal cannula oxygen Cardiovascular status: blood pressure returned to baseline and stable Postop Assessment: no apparent nausea or vomiting Anesthetic complications: no   No notable events documented.  Last Vitals:  Vitals:   04/06/23 1715 04/06/23 1730  BP: (!) 132/90 (!) 146/87  Pulse: 65 74  Resp: 10 (!) 21  Temp:  (!) 36.2 C  SpO2: 95% 97%    Last Pain:  Vitals:   04/06/23 1730  PainSc: 4                  Mariann Barter

## 2024-02-18 IMAGING — CT CT HEAD W/O CM
3 of 4 series · 16 of 47 positions shown, 19 images · non-contrast
Comparison: CT head dated May 31, 2008.

CLINICAL DATA: Headache and hypertension.



[Series 3: head 5.0 h30s · axial · 0.42mm/px · z∈[+1412,+1542]mm · 10 of 32 slices shown, 13 images]
[im 3/32  brain]
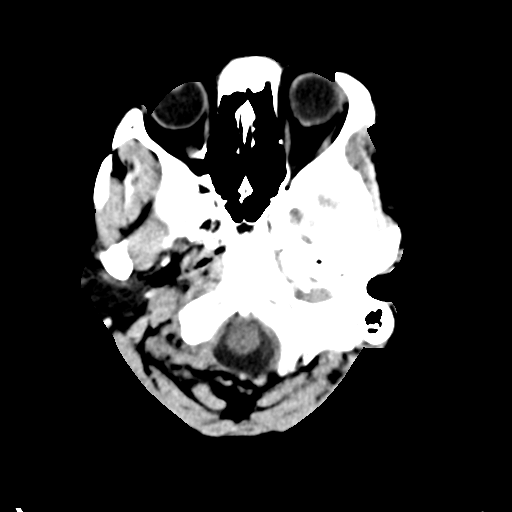
[im 3/32  bone]
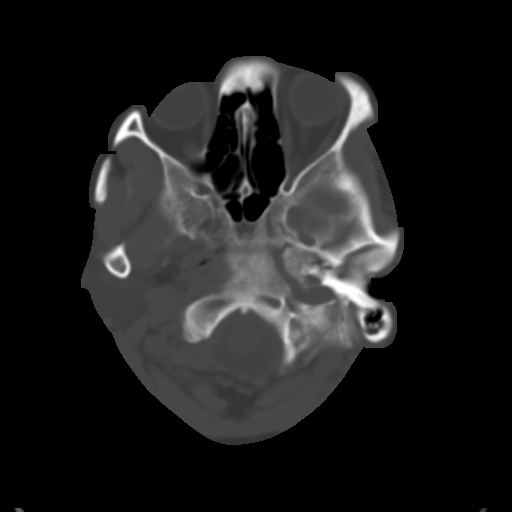
[im 5/32  brain]
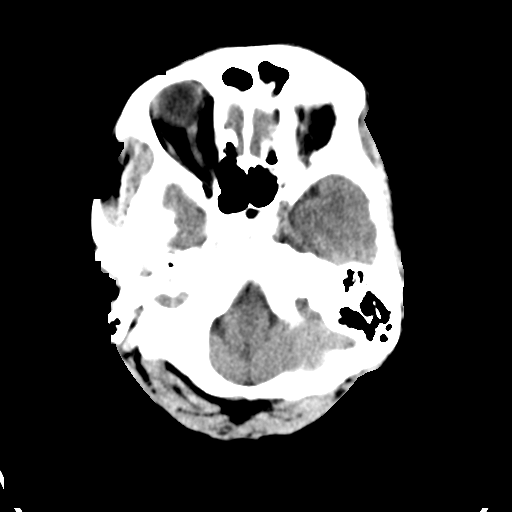
[im 9/32  brain]
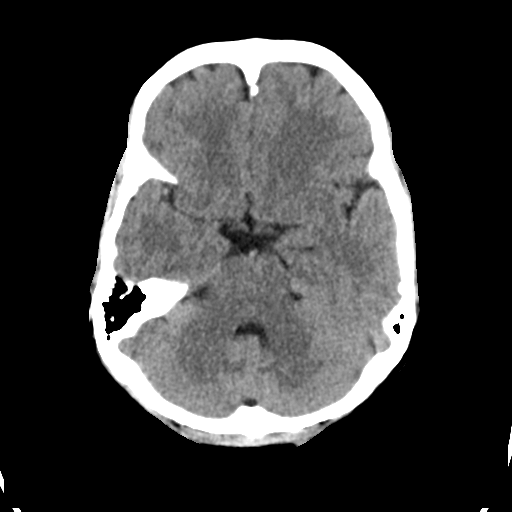
[im 12/32  brain]
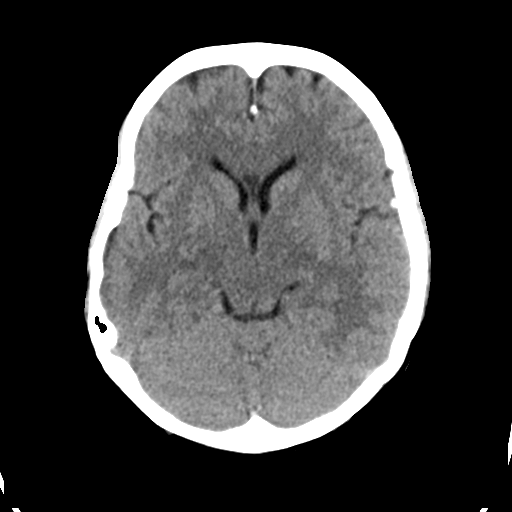
[im 14/32  brain]
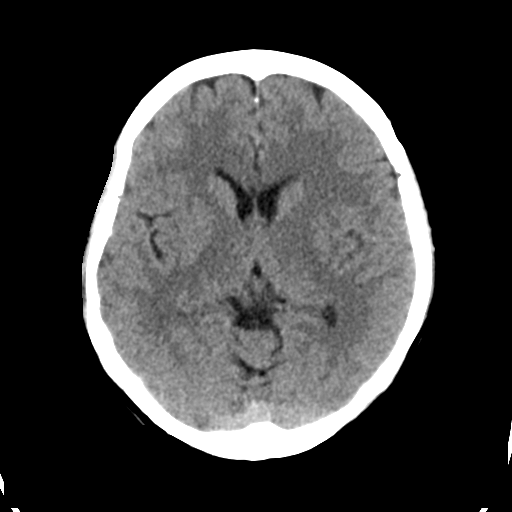
[im 14/32  bone]
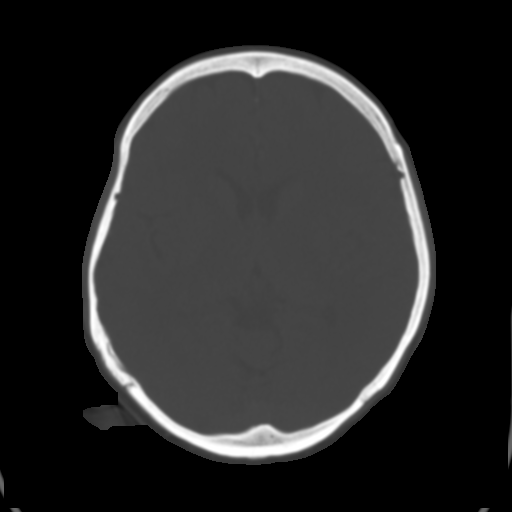
[im 18/32  brain]
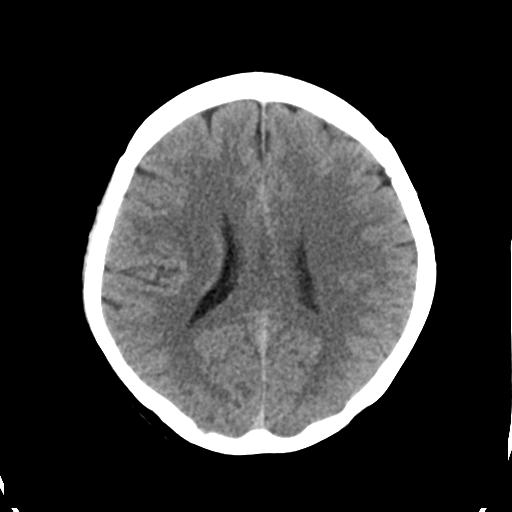
[im 20/32  brain]
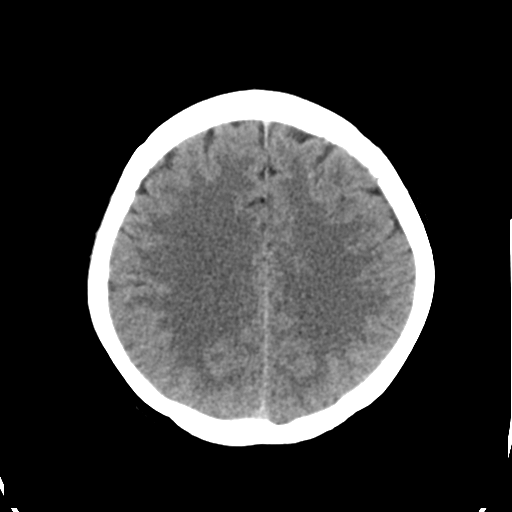
[im 23/32  brain]
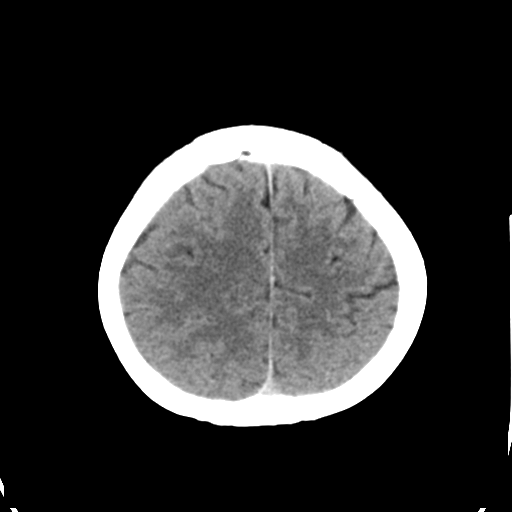
[im 27/32  brain]
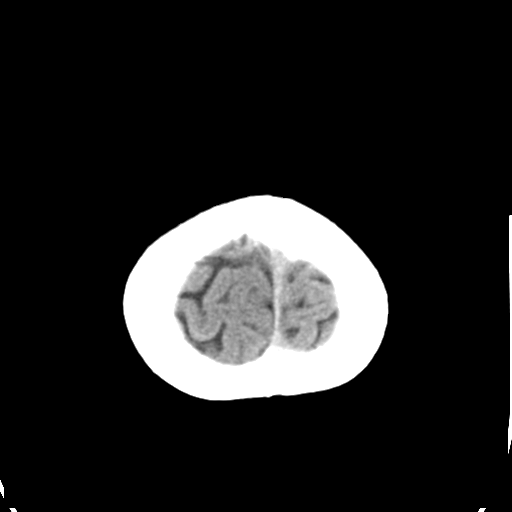
[im 27/32  bone]
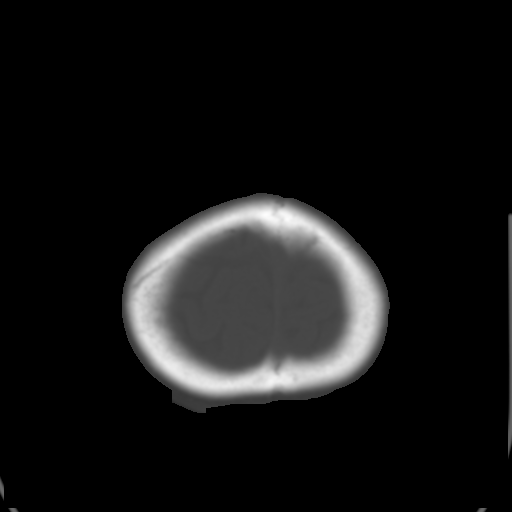
[im 29/32  brain]
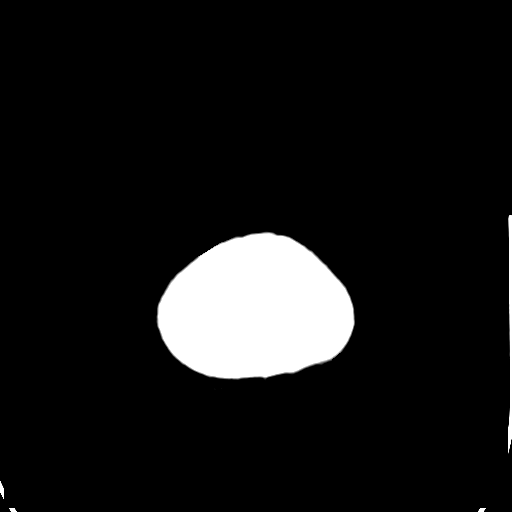

[Series 5: head 3.0 mpr cor · coronal · 0.31mm/px · 3 of 65 slices shown]
[im 22/65  brain]
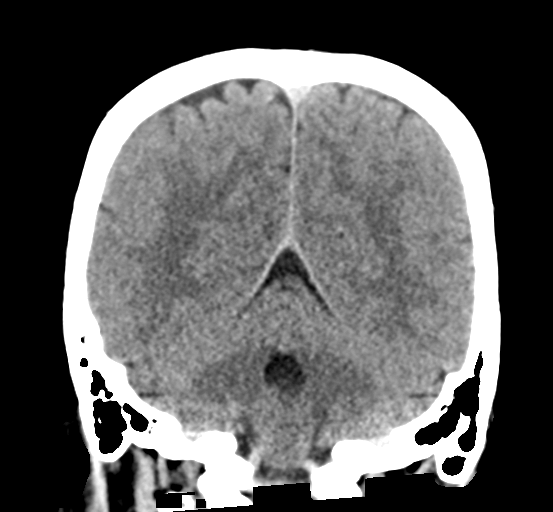
[im 29/65  brain]
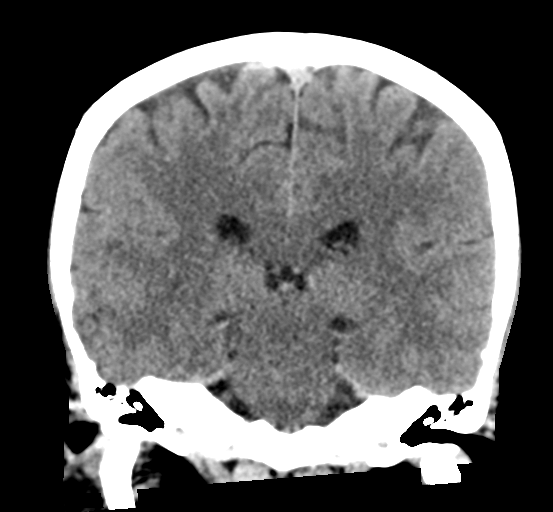
[im 36/65  brain]
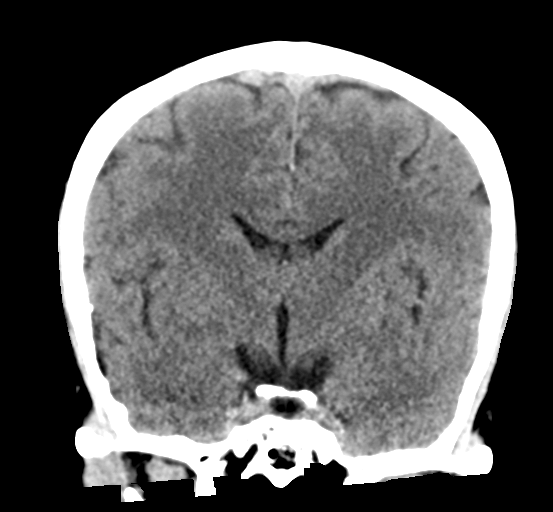

[Series 6: head 3.0 mpr sag · sagittal · 0.32mm/px · 3 of 62 slices shown]
[im 21/62  brain]
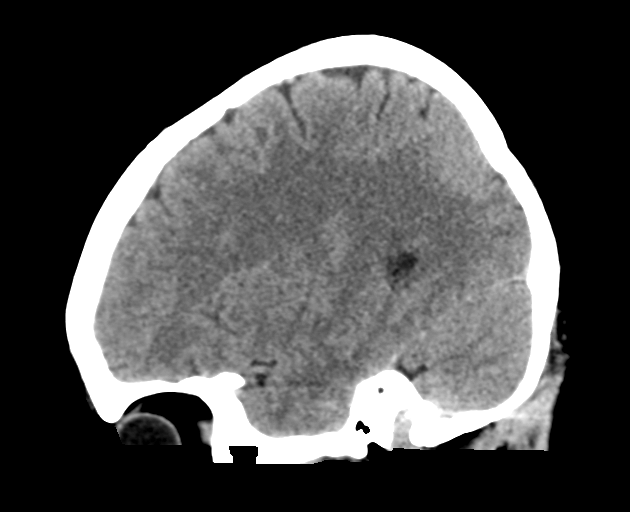
[im 31/62  brain]
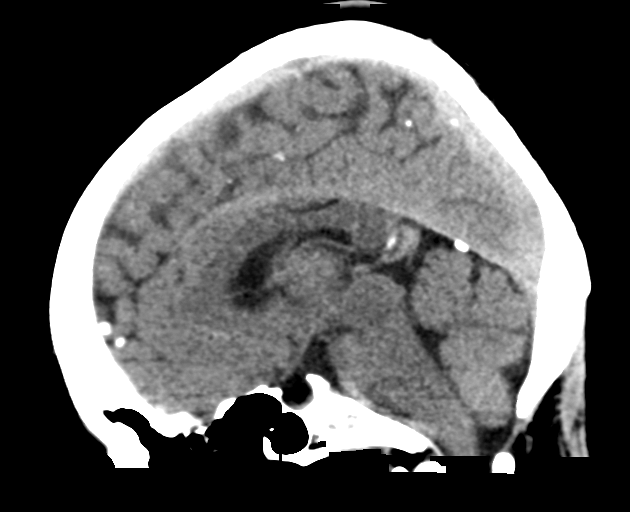
[im 41/62  brain]
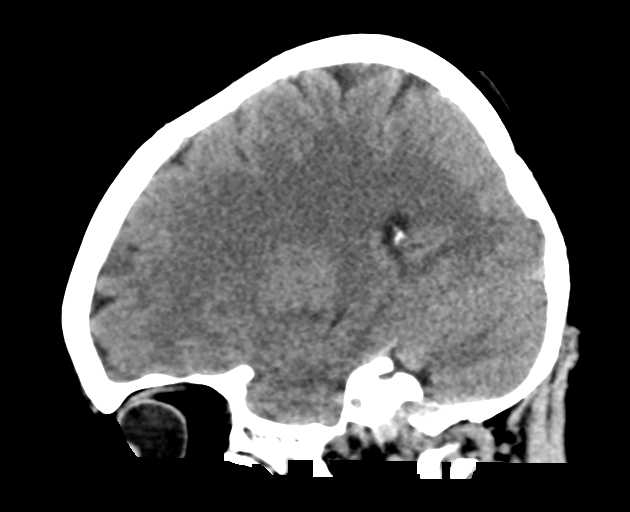

[16 of 47 positions shown; findings below may reference images not displayed]

FINDINGS: Brain: No evidence of acute infarction, hemorrhage, hydrocephalus,
extra-axial collection or mass lesion/mass effect.

Vascular: No hyperdense vessel or unexpected calcification.

Skull: Normal. Negative for fracture or focal lesion.

Sinuses/Orbits: No acute finding.

Other: None.
IMPRESSION: 1. No acute intracranial abnormality.

## 2024-03-15 IMAGING — US US ABDOMEN LIMITED
1 series · 14 of 25 positions shown · non-contrast
Comparison: None.

CLINICAL DATA: Right upper quadrant abdominal pain, intermittent.

EXAM:
ULTRASOUND ABDOMEN LIMITED RIGHT UPPER QUADRANT

[Series 1: us abdomen limited · 0.15mm/px · 14 of 46 slices shown]
[im 1/46]
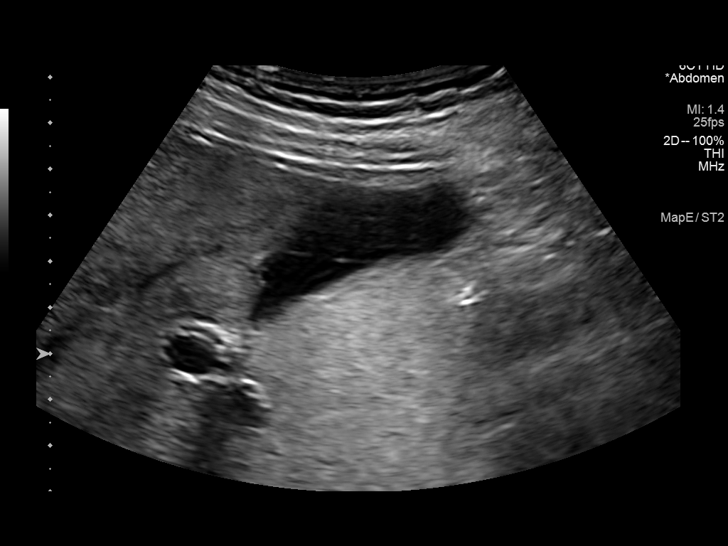
[im 4/46]
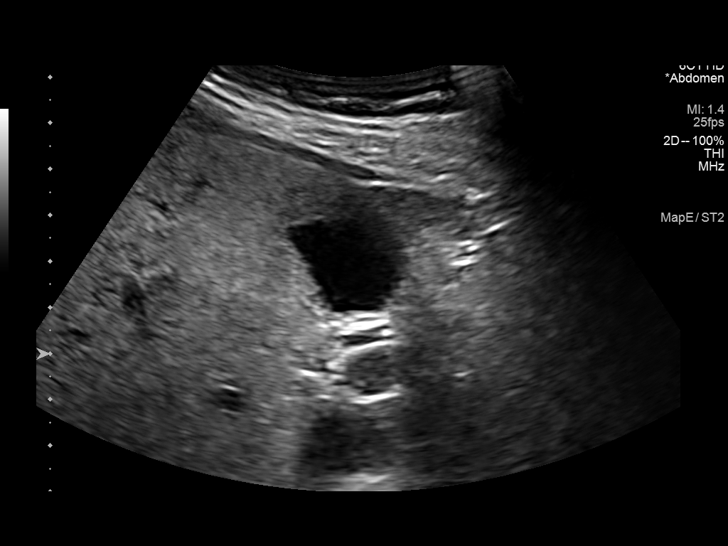
[im 8/46]
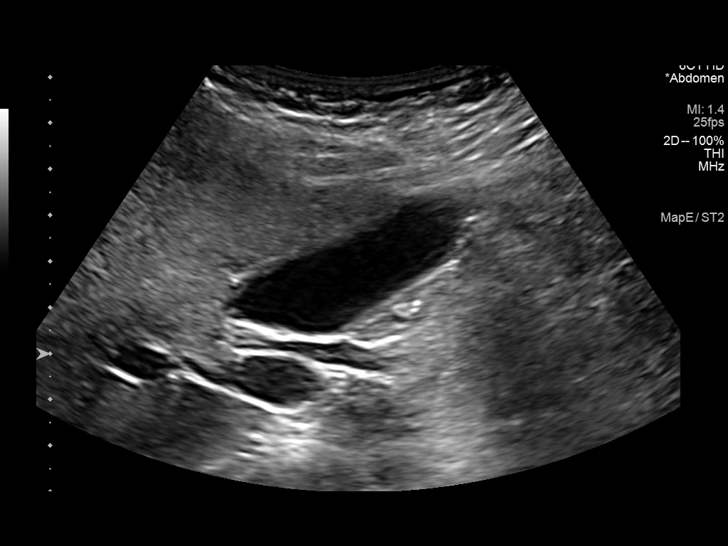
[im 12/46]
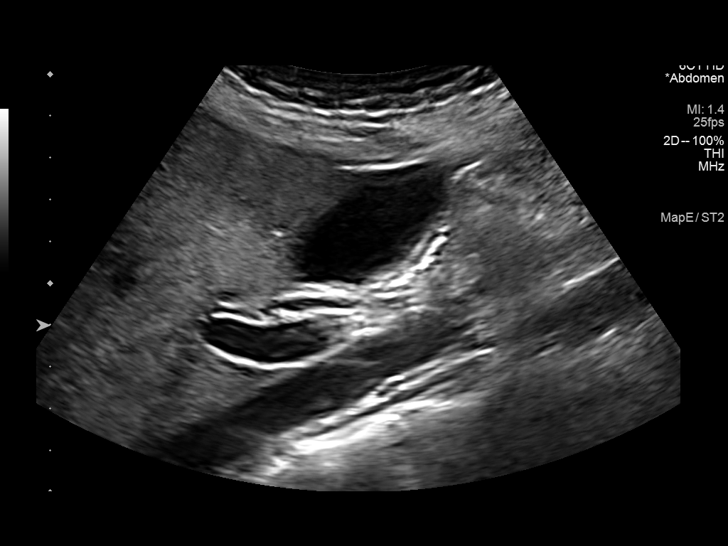
[im 16/46]
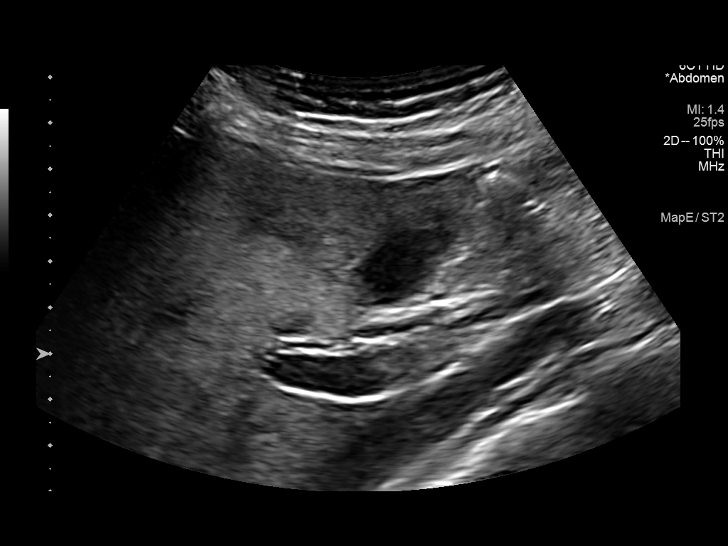
[im 17/46]
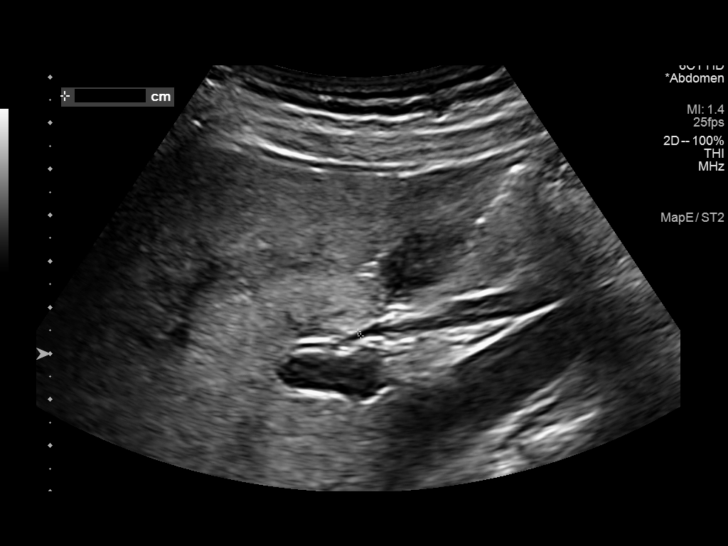
[im 21/46]
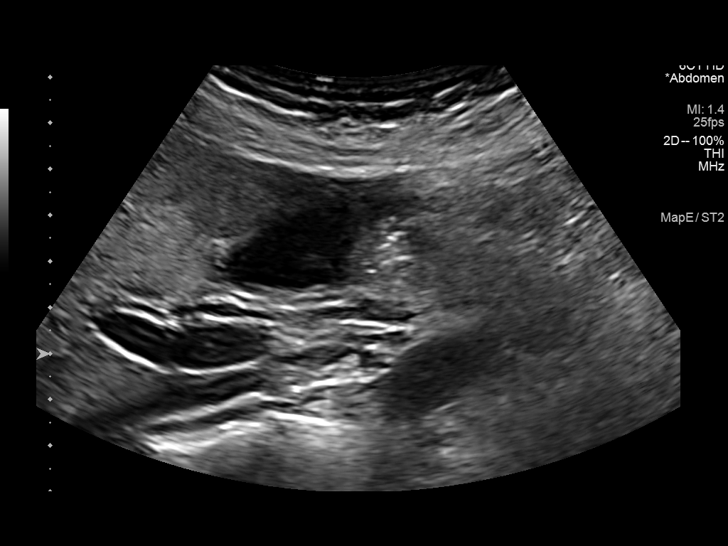
[im 25/46]
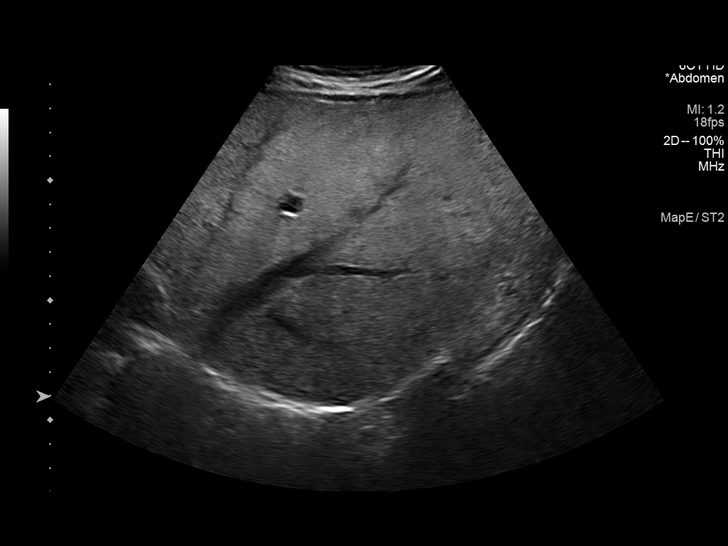
[im 29/46]
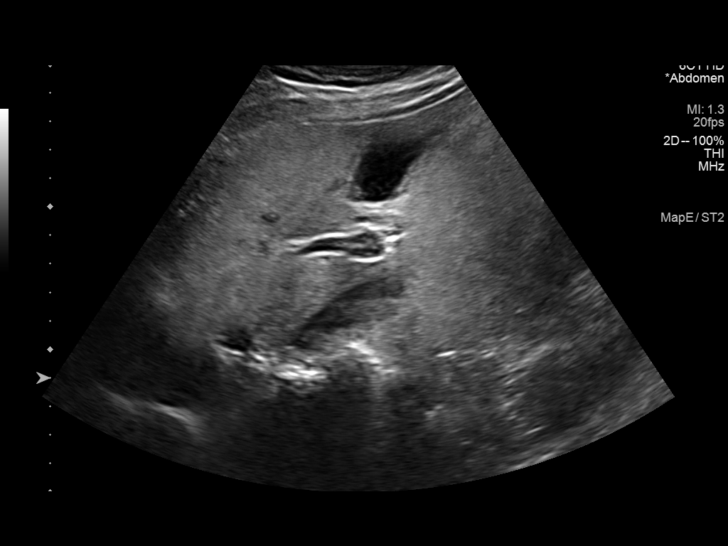
[im 31/46]
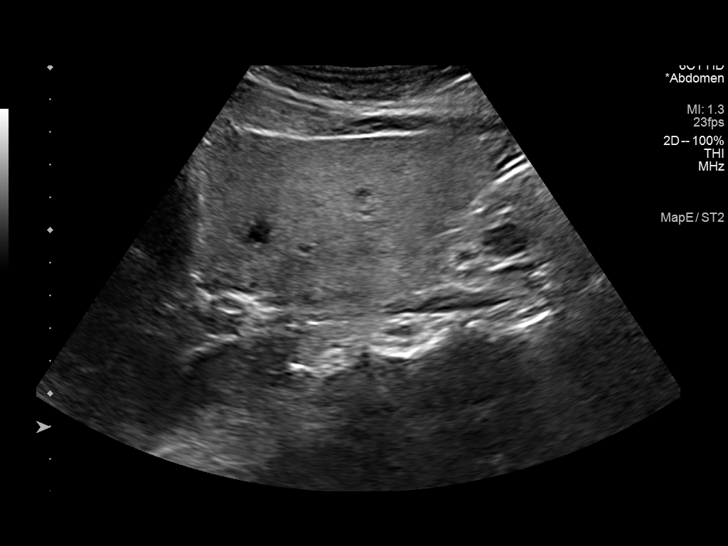
[im 34/46]
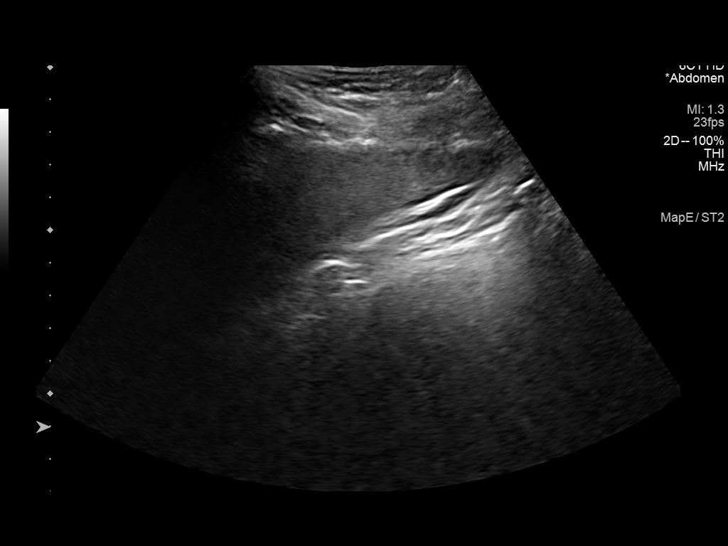
[im 38/46]
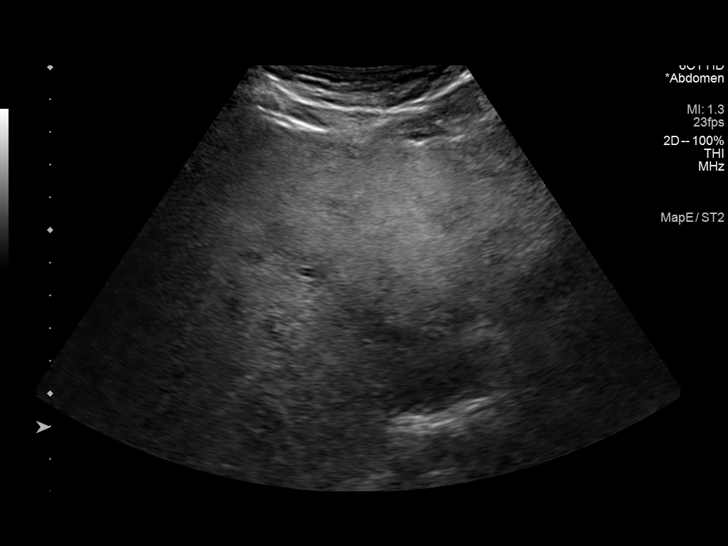
[im 42/46]
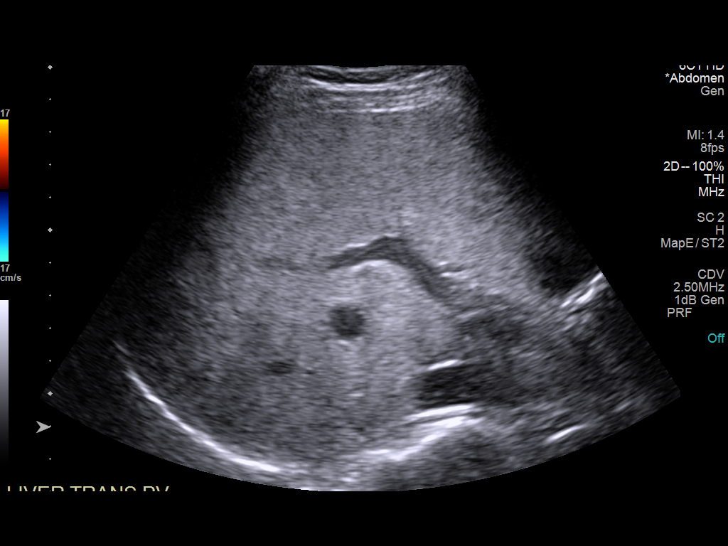
[im 46/46]
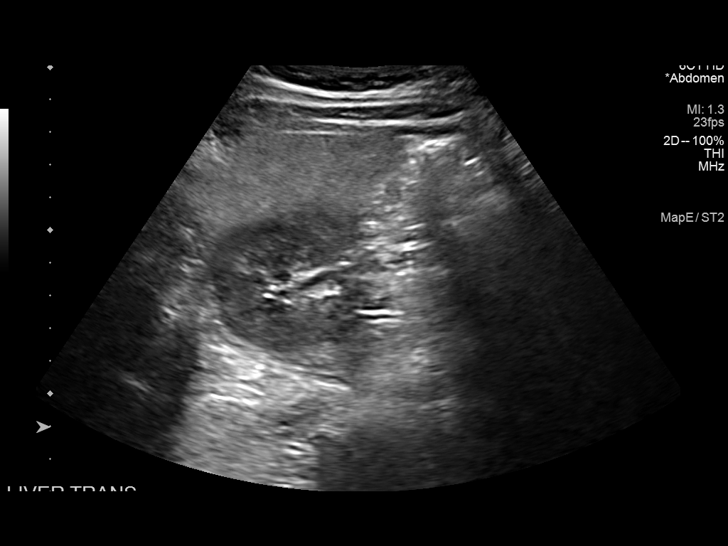

[14 of 25 positions shown; findings below may reference images not displayed]

FINDINGS: Gallbladder:

No gallstones or wall thickening visualized. No sonographic Murphy
sign noted by sonographer.

Common bile duct:

Diameter: 1 mm

Liver:

Increased in echogenicity. No focal lesion. Portal vein is patent on
color Doppler imaging with normal direction of blood flow towards
the liver.

Other: None.
IMPRESSION: No cholelithiasis or sonographic evidence for acute cholecystitis.

Hepatic steatosis

## 2024-03-21 ENCOUNTER — Other Ambulatory Visit: Payer: Self-pay | Admitting: Family Medicine

## 2024-03-21 DIAGNOSIS — Z1231 Encounter for screening mammogram for malignant neoplasm of breast: Secondary | ICD-10-CM

## 2024-04-05 ENCOUNTER — Ambulatory Visit
Admission: RE | Admit: 2024-04-05 | Discharge: 2024-04-05 | Disposition: A | Discharge status: Home / Self Care | Source: Ambulatory Visit | Attending: Family Medicine | Admitting: Family Medicine

## 2024-04-05 DIAGNOSIS — Z1231 Encounter for screening mammogram for malignant neoplasm of breast: Secondary | ICD-10-CM | POA: Insufficient documentation

## 2024-04-23 ENCOUNTER — Emergency Department (HOSPITAL_COMMUNITY)

## 2024-04-23 ENCOUNTER — Other Ambulatory Visit: Payer: Self-pay

## 2024-04-23 ENCOUNTER — Observation Stay (HOSPITAL_COMMUNITY)
Admission: EM | Admit: 2024-04-23 | Discharge: 2024-04-25 | Disposition: A | Discharge status: Home / Self Care | Attending: Internal Medicine | Admitting: Internal Medicine

## 2024-04-23 ENCOUNTER — Encounter (HOSPITAL_COMMUNITY): Payer: Self-pay

## 2024-04-23 DIAGNOSIS — E876 Hypokalemia: Secondary | ICD-10-CM | POA: Diagnosis not present

## 2024-04-23 DIAGNOSIS — M549 Dorsalgia, unspecified: Secondary | ICD-10-CM | POA: Diagnosis not present

## 2024-04-23 DIAGNOSIS — E872 Acidosis, unspecified: Secondary | ICD-10-CM | POA: Diagnosis not present

## 2024-04-23 DIAGNOSIS — Z79899 Other long term (current) drug therapy: Secondary | ICD-10-CM | POA: Insufficient documentation

## 2024-04-23 DIAGNOSIS — M542 Cervicalgia: Secondary | ICD-10-CM | POA: Insufficient documentation

## 2024-04-23 DIAGNOSIS — J189 Pneumonia, unspecified organism: Principal | ICD-10-CM | POA: Insufficient documentation

## 2024-04-23 DIAGNOSIS — I10 Essential (primary) hypertension: Secondary | ICD-10-CM | POA: Insufficient documentation

## 2024-04-23 DIAGNOSIS — M791 Myalgia, unspecified site: Secondary | ICD-10-CM | POA: Insufficient documentation

## 2024-04-23 DIAGNOSIS — M79606 Pain in leg, unspecified: Secondary | ICD-10-CM | POA: Diagnosis present

## 2024-04-23 DIAGNOSIS — R9431 Abnormal electrocardiogram [ECG] [EKG]: Secondary | ICD-10-CM | POA: Diagnosis not present

## 2024-04-23 DIAGNOSIS — A419 Sepsis, unspecified organism: Secondary | ICD-10-CM | POA: Diagnosis not present

## 2024-04-23 LAB — COMPREHENSIVE METABOLIC PANEL WITH GFR
ALT: 73 U/L — ABNORMAL HIGH (ref 0–44)
AST: 44 U/L — ABNORMAL HIGH (ref 15–41)
Albumin: 4.7 g/dL (ref 3.5–5.0)
Alkaline Phosphatase: 74 U/L (ref 38–126)
Anion gap: 16 — ABNORMAL HIGH (ref 5–15)
BUN: 11 mg/dL (ref 6–20)
CO2: 21 mmol/L — ABNORMAL LOW (ref 22–32)
Calcium: 9.9 mg/dL (ref 8.9–10.3)
Chloride: 102 mmol/L (ref 98–111)
Creatinine, Ser: 0.72 mg/dL (ref 0.44–1.00)
GFR, Estimated: >60 mL/min
Glucose, Bld: 129 mg/dL — ABNORMAL HIGH (ref 70–99)
Potassium: 3.7 mmol/L (ref 3.5–5.1)
Sodium: 138 mmol/L (ref 135–145)
Total Bilirubin: 0.6 mg/dL (ref 0.0–1.2)
Total Protein: 7.9 g/dL (ref 6.5–8.1)

## 2024-04-23 LAB — URINALYSIS, W/ REFLEX TO CULTURE (INFECTION SUSPECTED)
Bacteria, UA: NONE SEEN
Bilirubin Urine: NEGATIVE
Glucose, UA: NEGATIVE mg/dL
Hgb urine dipstick: NEGATIVE
Ketones, ur: NEGATIVE mg/dL
Leukocytes,Ua: NEGATIVE
Nitrite: NEGATIVE
Protein, ur: NEGATIVE mg/dL
Specific Gravity, Urine: 1.009 (ref 1.005–1.030)
pH: 7 (ref 5.0–8.0)

## 2024-04-23 LAB — CBC WITH DIFFERENTIAL/PLATELET
Abs Immature Granulocytes: 0.07 K/uL (ref 0.00–0.07)
Basophils Absolute: 0 K/uL (ref 0.0–0.1)
Basophils Relative: 0 %
Eosinophils Absolute: 0 K/uL (ref 0.0–0.5)
Eosinophils Relative: 0 %
HCT: 43 % (ref 36.0–46.0)
Hemoglobin: 14.1 g/dL (ref 12.0–15.0)
Immature Granulocytes: 1 %
Lymphocytes Relative: 6 %
Lymphs Abs: 0.8 K/uL (ref 0.7–4.0)
MCH: 28 pg (ref 26.0–34.0)
MCHC: 32.8 g/dL (ref 30.0–36.0)
MCV: 85.3 fL (ref 80.0–100.0)
Monocytes Absolute: 0.6 K/uL (ref 0.1–1.0)
Monocytes Relative: 5 %
Neutro Abs: 11.9 K/uL — ABNORMAL HIGH (ref 1.7–7.7)
Neutrophils Relative %: 88 %
Platelets: 233 K/uL (ref 150–400)
RBC: 5.04 MIL/uL (ref 3.87–5.11)
RDW: 13.9 % (ref 11.5–15.5)
WBC: 13.5 K/uL — ABNORMAL HIGH (ref 4.0–10.5)
nRBC: 0 % (ref 0.0–0.2)

## 2024-04-23 LAB — CK: Total CK: 114 U/L (ref 38–234)

## 2024-04-23 LAB — I-STAT CG4 LACTIC ACID, ED
Lactic Acid, Venous: 2.2 mmol/L (ref 0.5–1.9)
Lactic Acid, Venous: 2.4 mmol/L (ref 0.5–1.9)

## 2024-04-23 LAB — HIV ANTIBODY (ROUTINE TESTING W REFLEX): HIV Screen 4th Generation wRfx: NONREACTIVE

## 2024-04-23 LAB — RESP PANEL BY RT-PCR (RSV, FLU A&B, COVID)  RVPGX2
Influenza A by PCR: NEGATIVE
Influenza B by PCR: NEGATIVE
Resp Syncytial Virus by PCR: NEGATIVE
SARS Coronavirus 2 by RT PCR: NEGATIVE

## 2024-04-23 LAB — LIPASE, BLOOD: Lipase: 20 U/L (ref 11–51)

## 2024-04-23 LAB — MAGNESIUM: Magnesium: 1.6 mg/dL — ABNORMAL LOW (ref 1.7–2.4)

## 2024-04-23 LAB — LACTIC ACID, PLASMA: Lactic Acid, Venous: 1.7 mmol/L (ref 0.5–1.9)

## 2024-04-23 MED ORDER — SODIUM CHLORIDE 0.9 % IV BOLUS
1000.0000 mL | Freq: Once | INTRAVENOUS | Status: AC
  Administered 2024-04-23: 1000 mL via INTRAVENOUS

## 2024-04-23 MED ORDER — SODIUM CHLORIDE 0.9 % IV SOLN
1.0000 g | Freq: Once | INTRAVENOUS | Status: AC
  Administered 2024-04-23: 1 g via INTRAVENOUS
  Filled 2024-04-23: qty 10

## 2024-04-23 MED ORDER — IPRATROPIUM-ALBUTEROL 0.5-2.5 (3) MG/3ML IN SOLN
3.0000 mL | Freq: Four times a day (QID) | RESPIRATORY_TRACT | Status: DC
  Administered 2024-04-23 – 2024-04-24 (×3): 3 mL via RESPIRATORY_TRACT
  Filled 2024-04-23 (×4): qty 3

## 2024-04-23 MED ORDER — LACTATED RINGERS IV BOLUS (SEPSIS)
250.0000 mL | Freq: Once | INTRAVENOUS | Status: AC
  Administered 2024-04-23: 250 mL via INTRAVENOUS

## 2024-04-23 MED ORDER — ACETAMINOPHEN 500 MG PO TABS
1000.0000 mg | ORAL_TABLET | Freq: Once | ORAL | Status: AC
  Administered 2024-04-23: 1000 mg via ORAL
  Filled 2024-04-23: qty 2

## 2024-04-23 MED ORDER — DOXYCYCLINE HYCLATE 100 MG PO TABS
100.0000 mg | ORAL_TABLET | Freq: Two times a day (BID) | ORAL | Status: DC
  Administered 2024-04-24 – 2024-04-25 (×3): 100 mg via ORAL
  Filled 2024-04-23 (×3): qty 1

## 2024-04-23 MED ORDER — AZITHROMYCIN 250 MG PO TABS
500.0000 mg | ORAL_TABLET | Freq: Once | ORAL | Status: AC
  Administered 2024-04-23: 500 mg via ORAL
  Filled 2024-04-23: qty 2

## 2024-04-23 MED ORDER — LACTATED RINGERS IV BOLUS (SEPSIS)
1000.0000 mL | Freq: Once | INTRAVENOUS | Status: AC
  Administered 2024-04-23: 1000 mL via INTRAVENOUS

## 2024-04-23 MED ORDER — PROCHLORPERAZINE EDISYLATE 10 MG/2ML IJ SOLN: 5.0000 mg | Freq: Four times a day (QID) | INTRAMUSCULAR | Status: DC | PRN

## 2024-04-23 MED ORDER — POLYETHYLENE GLYCOL 3350 17 G PO PACK: 17.0000 g | PACK | Freq: Every day | ORAL | Status: DC | PRN

## 2024-04-23 MED ORDER — MAGNESIUM SULFATE 2 GM/50ML IV SOLN: 2.0000 g | Freq: Once | INTRAVENOUS | Status: DC

## 2024-04-23 MED ORDER — ACETAMINOPHEN 500 MG PO TABS
500.0000 mg | ORAL_TABLET | Freq: Four times a day (QID) | ORAL | Status: DC | PRN
  Administered 2024-04-24: 500 mg via ORAL
  Filled 2024-04-23: qty 1

## 2024-04-23 MED ORDER — GUAIFENESIN-DM 100-10 MG/5ML PO SYRP: 5.0000 mL | ORAL_SOLUTION | ORAL | Status: DC | PRN

## 2024-04-23 MED ORDER — AZITHROMYCIN 250 MG PO TABS: 500.0000 mg | ORAL_TABLET | Freq: Every day | ORAL | Status: DC

## 2024-04-23 MED ORDER — LISINOPRIL 10 MG PO TABS: 5.0000 mg | ORAL_TABLET | Freq: Every day | ORAL | Status: DC

## 2024-04-23 MED ORDER — IBUPROFEN 400 MG PO TABS
600.0000 mg | ORAL_TABLET | Freq: Once | ORAL | Status: AC
  Administered 2024-04-23: 600 mg via ORAL
  Filled 2024-04-23: qty 1

## 2024-04-23 MED ORDER — MAGNESIUM SULFATE 2 GM/50ML IV SOLN
2.0000 g | Freq: Once | INTRAVENOUS | Status: AC
  Administered 2024-04-23: 2 g via INTRAVENOUS
  Filled 2024-04-23: qty 50

## 2024-04-23 MED ORDER — LACTATED RINGERS IV SOLN: INTRAVENOUS | Status: AC

## 2024-04-23 MED ORDER — MELATONIN 5 MG PO TABS
5.0000 mg | ORAL_TABLET | Freq: Every evening | ORAL | Status: DC | PRN
  Administered 2024-04-24: 5 mg via ORAL
  Filled 2024-04-23: qty 1

## 2024-04-23 MED ORDER — LACTATED RINGERS IV BOLUS (SEPSIS)
500.0000 mL | Freq: Once | INTRAVENOUS | Status: AC
  Administered 2024-04-23: 500 mL via INTRAVENOUS

## 2024-04-23 MED ORDER — IPRATROPIUM-ALBUTEROL 0.5-2.5 (3) MG/3ML IN SOLN: 3.0000 mL | RESPIRATORY_TRACT | Status: DC | PRN

## 2024-04-23 MED ORDER — ENOXAPARIN SODIUM 40 MG/0.4ML IJ SOSY
40.0000 mg | PREFILLED_SYRINGE | INTRAMUSCULAR | Status: DC
  Administered 2024-04-23 – 2024-04-24 (×2): 40 mg via SUBCUTANEOUS
  Filled 2024-04-23 (×2): qty 0.4

## 2024-04-23 MED ORDER — SODIUM CHLORIDE 0.9 % IV SOLN
2.0000 g | INTRAVENOUS | Status: DC
  Administered 2024-04-24: 2 g via INTRAVENOUS
  Filled 2024-04-23: qty 20

## 2024-04-23 MED ORDER — TRIMETHOBENZAMIDE HCL 100 MG/ML IM SOLN: 200.0000 mg | Freq: Three times a day (TID) | INTRAMUSCULAR | Status: DC | PRN

## 2024-04-23 NOTE — H&P (Incomplete)
 " History and Physical  Carrie Carpenter FMW:[id number] DOB: [birth year] DOA: 04/23/2024  Referring physician: Glendia Longs, PA-EDP  PCP: Arloa Lamar SQUIBB, MD  Outpatient Specialists: None Patient coming from: Home  Chief Complaint:  Generalized achiness  HPI: Carrie Carpenter is a 59 y.o. female with medical history significant for hypertension, who presents to the ER with complaints of muscle pain of all extremities, subjective fevers, shaking chills, denies any trauma.  Symptoms started abruptly today.  In the ER, febrile with Tmax 102.8, tachycardic 102, tachypneic 24.  Ill-appearing.  UA was negative for pyuria.  Chest x-ray revealed mild streaky lower lobe bronchovascular opacities suggest early bibasilar pneumonia.  Lab studies were notable for leukocytosis 13.5, lactic acid 2.4.  The patient received Rocephin  and azithromycin  for early CAP.  TRH, hospitalist service, consulted to admit.  ED Course: Tmax 102.8.  BP 161/96, pulse 102, respiration rate 24, O2 saturation 100% on room air.  Review of Systems: Review of systems as noted in the HPI. All other systems reviewed and are negative.   Past Medical History:  Diagnosis Date   Allergy    GERD (gastroesophageal reflux disease)    Headache    migraines   Hypertension    Past Surgical History:  Procedure Laterality Date   ABDOMINAL HYSTERECTOMY  04/06/2017   CYSTOSCOPY  04/06/2017   Procedure: CYSTOSCOPY;  Surgeon: Arloa Lamar SQUIBB, MD;  Location: [facility] ORS;  Service: Gynecology;;   LAPAROSCOPIC HYSTERECTOMY Bilateral 04/06/2017   Procedure: HYSTERECTOMY TOTAL LAPAROSCOPIC BILATERAL SALPINGECTOMY;  Surgeon: Arloa Lamar SQUIBB, MD;  Location: [facility] ORS;  Service: Gynecology;  Laterality: Bilateral;   PARATHYROIDECTOMY Left 04/06/2023   Procedure: LEFT PARATHYROIDECTOMY;  Surgeon: Carlie Clark, MD;  Location: [facility];  Service: ENT;  Laterality: Left;   TUBAL LIGATION      Social History:  reports that she has never smoked. She has never  used smokeless tobacco. She reports that she does not drink alcohol and does not use drugs.   Allergies[1]  Family History  Problem Relation Age of Onset   Healthy Daughter    Healthy Daughter    Healthy Mother    Healthy Brother    Healthy Son    Breast cancer Neg Hx       Prior to Admission medications  Medication Sig Start Date End Date Taking? Authorizing Provider  acetaminophen  (TYLENOL ) 325 MG tablet Take 650 mg by mouth daily as needed.    [provider]  lisinopril  (PRINIVIL ,ZESTRIL ) 5 MG tablet Take 1 tablet (5 mg total) by mouth daily. 04/25/18   Sofia, Leslie K, PA-C  Pseudoeph-Doxylamine-DM-APAP (NYQUIL PO) Take by mouth. Patient not taking: Reported on 06/30/2021    [provider]    Physical Exam: BP (!) 161/96 (BP Location: Left Arm)   Pulse (!) 102   Temp (!) 102.8 F (39.3 C) (Oral)   Resp 15   Ht 5' 3 (1.6 m)   Wt 55.3 kg   LMP 03/01/2017   SpO2 100%   BMI 21.61 kg/m   General: 59 y.o. year-old female well developed well nourished in no acute distress.  Alert and oriented x3. Cardiovascular: Regular rate and rhythm with no rubs or gallops.  No thyromegaly or JVD noted.  No lower extremity edema. 2/4 pulses in all 4 extremities. Respiratory: Diffuse rales bilaterally. Good inspiratory effort. Abdomen: Soft nontender nondistended with normal bowel sounds x4 quadrants. Muskuloskeletal: No cyanosis, clubbing or edema noted bilaterally Neuro: CN II-XII intact, strength, sensation, reflexes Skin: No  ulcerative lesions noted or rashes Psychiatry: Judgement and insight appear normal. Mood is appropriate for condition and setting          Labs on Admission:  Basic Metabolic Panel: Recent Labs  Lab 04/23/24 1233 04/23/24 1250  NA 138  --   K 3.7  --   CL 102  --   CO2 21*  --   GLUCOSE 129*  --   BUN 11  --   CREATININE 0.72  --   CALCIUM 9.9  --   MG  --  1.6*   Liver Function Tests: Recent Labs  Lab 04/23/24 1233  AST  44*  ALT 73*  ALKPHOS 74  BILITOT 0.6  PROT 7.9  ALBUMIN 4.7   Recent Labs  Lab 04/23/24 1233  LIPASE 20   No results for input(s): AMMONIA in the last 168 hours. CBC: Recent Labs  Lab 04/23/24 1233  WBC 13.5*  NEUTROABS 11.9*  HGB 14.1  HCT 43.0  MCV 85.3  PLT 233   Cardiac Enzymes: Recent Labs  Lab 04/23/24 1753  CKTOTAL 114    BNP (last 3 results) No results for input(s): BNP in the last 8760 hours.  ProBNP (last 3 results) No results for input(s): PROBNP in the last 8760 hours.  CBG: No results for input(s): GLUCAP in the last 168 hours.  Radiological Exams on Admission: DG Chest 1 View Result Date: 04/23/2024 CLINICAL DATA:  Fever EXAM: CHEST  1 VIEW COMPARISON:  08/08/2014 FINDINGS: Stable heart size and vascularity. Mild increased streaky lower lobe bronchovascular opacities compared to the prior study may represent early bibasilar pneumonia pattern. Negative for edema, effusion or pneumothorax. Trachea midline. Artifact overlies the right upper lobe. No osseous abnormality. IMPRESSION: Mild streaky lower lobe bronchovascular opacities suggest early bibasilar pneumonia. Electronically Signed   By: CHRISTELLA.  Shick M.D.   On: 04/23/2024 16:10   MR LUMBAR SPINE WO CONTRAST Result Date: 04/23/2024 EXAM: MRI LUMBAR SPINE 04/23/2024 03:21:01 PM TECHNIQUE: Multiplanar multisequence MRI of the lumbar spine was performed without the administration of intravenous contrast. COMPARISON: CT abdomen 11/24/2017. 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 FINDINGS: BONES AND ALIGNMENT: Normal alignment. Normal vertebral body heights. Background marrow signal is unremarkable unless otherwise stated. Limbus vertebra at L4. SPINAL CORD: The conus terminates normally at the T12-L1 level. SOFT TISSUES: No paraspinal mass. T12-L1: Small left paracentral disc protrusion. No impingement. L1-L2: Unremarkable. L2-L3: Mild disc bulge. No  impingement. L3-L4: Mild disc bulge. Disc desiccation. No impingement. L4-L5: Mild disc bulge. Disc desiccation. No impingement. L5-S1: Disc bulge. Mild degenerative facet arthropathy. Disc desiccation. No impingement. IMPRESSION: 1. Degenerative findings without overt impingement at T12-L1, L2-3, L3-4, L4-5, and L5-S1. 2. Chronic L4 limbus vertebra. Electronically signed by: Ryan Salvage MD 04/23/2024 03:39 PM EST RP Workstation: [id number]    EKG: I independently viewed the EKG done and my findings are as followed: Sinus tachycardia rate of 111.  QTc 633.  Assessment/Plan Present on Admission:  CAP (community acquired pneumonia)  Principal Problem:   CAP (community acquired pneumonia)  Sepsis secondary to early community-acquired pneumonia, POA Continue Rocephin , azithromycin  initiated in the ER DuoNebs every 6 hours and every 2 hours as needed As needed antitussives Early mobilization Follow peripheral blood cultures x 2  Hypertension BP is not at goal, elevated Resume home lisinopril  Closely monitor vital signs  High anion gap metabolic acidosis Presented with serum bicarb 21, anion gap 16, lactic acid 2.4 Continue IV hydration  Lactic  acidosis secondary to community-acquired pneumonia. Lactic acid 2.4 Improved after IV fluid hydration Repeated lactic acid 1.7.  Hypomagnesemia Serum magnesium  1.6 Repleted intravenously.    Critical care time: 55 minutes.    DVT prophylaxis: Subcu Lovenox  daily.  Code Status: Full code.  Family Communication: None at bedside.  Disposition Plan: Admitted to telemetry unit.  Consults called: None.  Admission status: Inpatient status.   Status is: Inpatient The patient requires at least 2 midnights for further evaluation and treatment of present condition.   Terry LOISE Hurst MD [facility] Pager [phone]  If 7PM-7AM, please contact night-coverage [email or web address] Password [email or web address]  04/23/2024, 7:56 PM       [1] No Known Allergies  "

## 2024-04-23 NOTE — ED Provider Notes (Signed)
 " [place] EMERGENCY DEPARTMENT AT [facility] Provider Note   CSN: [id number] Arrival date & time: 04/23/24  1216     Patient presents with: Leg Pain   Carrie Carpenter is a 59 y.o. female.   59 year old female presenting with muscle pain of all extremities, fever, shaking chills.  Patient notes onset of symptoms began abruptly while she was at work today, patient works in a sales executive facility, her daughter reports that her job is not overly strenuous.  She notes weakness and muscle pain that she describes as cramping primarily from her back down her legs and is now beginning to affect her arms.  Denies fever at home, no fecal or urinary incontinence, no fall/inciting event/injury.  Denies respiratory symptoms like cough or shortness of breath, denies chest pain, denies urinary symptoms.  She has not started any medications recently.   Leg Pain      Prior to Admission medications  Medication Sig Start Date End Date Taking? Authorizing Provider  acetaminophen  (TYLENOL ) 325 MG tablet Take 650 mg by mouth daily as needed.    [provider]  lisinopril  (PRINIVIL ,ZESTRIL ) 5 MG tablet Take 1 tablet (5 mg total) by mouth daily. 04/25/18   Sofia, Leslie K, PA-C  Pseudoeph-Doxylamine-DM-APAP (NYQUIL PO) Take by mouth. Patient not taking: Reported on 06/30/2021    [provider]    Allergies: Patient has no known allergies.    Review of Systems  Updated Vital Signs  Vitals:   04/23/24 1223 04/23/24 1226 04/23/24 1639 04/23/24 1933  BP: (!) 150/111  (!) 161/96   Pulse: (!) 114  (!) 102   Resp: 20  15   Temp: (!) 101.2 F (38.4 C)  98.5 F (36.9 C) (!) 102.8 F (39.3 C)  TempSrc:   Oral Oral  SpO2: 96%  100%   Weight:  55.3 kg    Height:  5' 3 (1.6 m)       Physical Exam Vitals and nursing note reviewed.  Constitutional:      Appearance: She is ill-appearing.     Comments: Shaking chills  HENT:     Head: Normocephalic and atraumatic.   Eyes:     Extraocular Movements: Extraocular movements intact.     Pupils: Pupils are equal, round, and reactive to light.     Comments: Conjunctival injection bilaterally  Cardiovascular:     Rate and Rhythm: Regular rhythm. Tachycardia present.  Pulmonary:     Effort: Pulmonary effort is normal. No respiratory distress.     Breath sounds: Normal breath sounds.  Abdominal:     Palpations: Abdomen is soft.     Tenderness: There is no abdominal tenderness. There is no guarding.  Musculoskeletal:     Cervical back: Normal range of motion.     Comments: Moves all extremities spontaneously without difficulty 5/5 strength against resistance of bilateral upper and lower extremities Compartments of the upper and lower extremities are soft however patient does endorse tenderness of multiple muscle groups on exam and movement of her extremities elicits further discomfort  Skin:    General: Skin is warm and dry.  Neurological:     Mental Status: She is alert and oriented to person, place, and time.     (all labs ordered are listed, but only abnormal results are displayed) Labs Reviewed  COMPREHENSIVE METABOLIC PANEL WITH GFR - Abnormal; Notable for the following components:      Result Value   CO2 21 (*)    Glucose,  Bld 129 (*)    AST 44 (*)    ALT 73 (*)    Anion gap 16 (*)    All other components within normal limits  CBC WITH DIFFERENTIAL/PLATELET - Abnormal; Notable for the following components:   WBC 13.5 (*)    Neutro Abs 11.9 (*)    All other components within normal limits  URINALYSIS, W/ REFLEX TO CULTURE (INFECTION SUSPECTED) - Abnormal; Notable for the following components:   Color, Urine STRAW (*)    All other components within normal limits  MAGNESIUM  - Abnormal; Notable for the following components:   Magnesium  1.6 (*)    All other components within normal limits  I-STAT CG4 LACTIC ACID, ED - Abnormal; Notable for the following components:   Lactic Acid, Venous 2.2  (*)    All other components within normal limits  I-STAT CG4 LACTIC ACID, ED - Abnormal; Notable for the following components:   Lactic Acid, Venous 2.4 (*)    All other components within normal limits  RESP PANEL BY RT-PCR (RSV, FLU A&B, COVID)  RVPGX2  CULTURE, BLOOD (ROUTINE X 2)  CULTURE, BLOOD (ROUTINE X 2)  LIPASE, BLOOD  CK  CBC  BASIC METABOLIC PANEL WITH GFR  MAGNESIUM   PHOSPHORUS  LACTIC ACID, PLASMA  LACTIC ACID, PLASMA  HIV ANTIBODY (ROUTINE TESTING W REFLEX)    EKG: EKG Interpretation Date/Time:  Tuesday April 23 2024 12:45:17 EST Ventricular Rate:  111 PR Interval:    QRS Duration:  102 QT Interval:  466 QTC Calculation: 633 R Axis:   34  Text Interpretation: Accelerated Junctional rhythm Incomplete right bundle branch block Nonspecific ST and T wave abnormality Prolonged QT Abnormal ECG When compared with ECG of 06-Apr-2023 12:51, PREVIOUS ECG IS PRESENT Confirmed by Cottie Cough [phone]) on 04/23/2024 6:38:14 PM  Radiology: [facility] Chest 1 View Result Date: 04/23/2024 CLINICAL DATA:  Fever EXAM: CHEST  1 VIEW COMPARISON:  08/08/2014 FINDINGS: Stable heart size and vascularity. Mild increased streaky lower lobe bronchovascular opacities compared to the prior study may represent early bibasilar pneumonia pattern. Negative for edema, effusion or pneumothorax. Trachea midline. Artifact overlies the right upper lobe. No osseous abnormality. IMPRESSION: Mild streaky lower lobe bronchovascular opacities suggest early bibasilar pneumonia. Electronically Signed   By: CHRISTELLA.  Shick M.D.   On: 04/23/2024 16:10   MR LUMBAR SPINE WO CONTRAST Result Date: 04/23/2024 EXAM: MRI LUMBAR SPINE 04/23/2024 03:21:01 PM TECHNIQUE: Multiplanar multisequence MRI of the lumbar spine was performed without the administration of intravenous contrast. COMPARISON: CT abdomen 11/24/2017. 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 FINDINGS: BONES AND  ALIGNMENT: Normal alignment. Normal vertebral body heights. Background marrow signal is unremarkable unless otherwise stated. Limbus vertebra at L4. SPINAL CORD: The conus terminates normally at the T12-L1 level. SOFT TISSUES: No paraspinal mass. T12-L1: Small left paracentral disc protrusion. No impingement. L1-L2: Unremarkable. L2-L3: Mild disc bulge. No impingement. L3-L4: Mild disc bulge. Disc desiccation. No impingement. L4-L5: Mild disc bulge. Disc desiccation. No impingement. L5-S1: Disc bulge. Mild degenerative facet arthropathy. Disc desiccation. No impingement. IMPRESSION: 1. Degenerative findings without overt impingement at T12-L1, L2-3, L3-4, L4-5, and L5-S1. 2. Chronic L4 limbus vertebra. Electronically signed by: Ryan Salvage MD 04/23/2024 03:39 PM EST RP Workstation: [id number]     .Critical Care  Performed by: Glendia Rocky SAILOR, PA-C Authorized by: Glendia Rocky SAILOR, PA-C   Critical care provider statement:    Critical care time (minutes):  42   Critical care time was  exclusive of:  Separately billable procedures and treating other patients and teaching time   Critical care was necessary to treat or prevent imminent or life-threatening deterioration of the following conditions:  Sepsis   Critical care was time spent personally by me on the following activities:  Ordering and performing treatments and interventions, development of treatment plan with patient or surrogate, ordering and review of laboratory studies, discussions with consultants, ordering and review of radiographic studies, evaluation of patient's response to treatment, re-evaluation of patient's condition, examination of patient and obtaining history from patient or surrogate   I assumed direction of critical care for this patient from another provider in my specialty: no     Care discussed with: admitting provider      Medications Ordered in the ED  lactated ringers  infusion (has no administration in time range)   lactated ringers  bolus 1,000 mL (1,000 mLs Intravenous New Bag/Given 04/23/24 1902)    And  lactated ringers  bolus 500 mL (500 mLs Intravenous New Bag/Given 04/23/24 2022)    And  lactated ringers  bolus 250 mL (has no administration in time range)  enoxaparin  (LOVENOX ) injection 40 mg (has no administration in time range)  acetaminophen  (TYLENOL ) tablet 500 mg (has no administration in time range)  melatonin tablet 5 mg (has no administration in time range)  polyethylene glycol (MIRALAX  / GLYCOLAX ) packet 17 g (has no administration in time range)  guaiFENesin -dextromethorphan (ROBITUSSIN DM) 100-10 MG/5ML syrup 5 mL (has no administration in time range)  ipratropium-albuterol  (DUONEB) 0.5-2.5 (3) MG/3ML nebulizer solution 3 mL (has no administration in time range)  ipratropium-albuterol  (DUONEB) 0.5-2.5 (3) MG/3ML nebulizer solution 3 mL (has no administration in time range)  magnesium  sulfate IVPB 2 g 50 mL (has no administration in time range)  trimethobenzamide  (TIGAN ) injection 200 mg (has no administration in time range)  acetaminophen  (TYLENOL ) tablet 1,000 mg (1,000 mg Oral Given 04/23/24 1756)  sodium chloride  0.9 % bolus 1,000 mL (0 mLs Intravenous Stopped 04/23/24 1820)  cefTRIAXone  (ROCEPHIN ) 1 g in sodium chloride  0.9 % 100 mL IVPB (0 g Intravenous Stopped 04/23/24 1901)  azithromycin  (ZITHROMAX ) tablet 500 mg (500 mg Oral Given 04/23/24 1901)  ibuprofen  (ADVIL ) tablet 600 mg (600 mg Oral Given 04/23/24 2022)                                    Medical Decision Making This patient presents to the ED for concern of muscle pain/cramping, this involves an extensive number of treatment options, and is a complaint that carries with it a high risk of complications and morbidity.  The differential diagnosis includes COVID/flu/RSV, rhabdomyolysis, pneumonia, urinary tract infection, cellulitis   Co morbidities that complicate the patient evaluation  Hypertension    Lab Tests:  I  Ordered, and personally interpreted labs.  The pertinent results include: CBC notable for leukocytosis of 13.5.  CMP with mild elevations in AST/ALT at 40/73 respectively, anion gap of 16.  Lipase within normal limits, 20.  Magnesium  borderline at 1.6.  Respiratory panel negative.  Urinalysis unremarkable.  Lactic 2.2 with repeat of 2.4. CK within normal limits.     Imaging Studies ordered:  I ordered imaging studies including MRI L-spine, CXR  I independently visualized and interpreted imaging which showed  - MRI: 1. Degenerative findings without overt impingement at T12-L1, L2-3, L3-4, L4-5, and L5-S1. 2. Chronic L4 limbus vertebra. - CXR: Mild streaky lower lobe bronchovascular opacities suggest  early bibasilar pneumonia.   I agree with the radiologist interpretation   Cardiac Monitoring: / EKG:  The patient was maintained on a cardiac monitor.  I personally viewed and interpreted the cardiac monitored which showed an underlying rhythm of: Sinus tachycardia   Consultations Obtained:  I requested consultation with the hospitalist,  and discussed lab and imaging findings as well as pertinent plan - they recommend: I spoke with Dr. Shona who agrees that this patient is appropriate for admission   Problem List / ED Course / Critical interventions / Medication management  Tylenol  and fluid bolus ordered in triage based on fever/tachycardia I ordered medication including ibuprofen  for return of fever, Rocephin /azithromycin  for suspected community-acquired pneumonia, additional fluid boluses given for sepsis. Reevaluation of the patient after these medicines showed that the patient improved I have reviewed the patients home medicines and have made adjustments as needed   Test / Admission - Considered:  Physical exam is remarkable as above, patient is ill-appearing at time my initial assessment and is demonstrating rigors, she endorses generalized muscle pain and is sore to the touch  symptomatic in multiple muscle groups of her upper and lower extremities.  Her lungs are clear to auscultation without adventitious sounds, she denies any recent respiratory symptoms, no chest pain.  Symptoms began suddenly today, patient is presenting with fever and tachycardia, meeting SIRS criteria.  Will initiate broad workup. Labs are notable as above, patient does have leukocytosis as well as elevated lactic acid with findings on chest x-ray consistent with community-acquired pneumonia.  Patient does meet sepsis criteria at this time based on these findings, will start broad-spectrum antibiotics with Rocephin  and azithromycin .  Patient has had a return of her fever, will administer ibuprofen . I spoke with the hospitalist service in regard to this patient who is in agreement that she would benefit from admission.  Patient and her daughter are in agreement with this plan.     Amount and/or Complexity of Data Reviewed Labs: ordered.  Risk Prescription drug management. Decision regarding hospitalization.        Final diagnoses:  Sepsis, due to unspecified organism, unspecified whether acute organ dysfunction present [facility])  Pneumonia of both lower lobes due to infectious organism    ED Discharge Orders     None          Glendia Rocky LOISE DEVONNA 04/23/24 2120    Cottie Donnice PARAS, MD 04/23/24 2312  "

## 2024-04-23 NOTE — ED Notes (Signed)
 Lactic results to raymar g,rn by at

## 2024-04-23 NOTE — ED Triage Notes (Signed)
 Pt ambulatory in waiting room, family with pt, states that pt was at work and started having some bilateral leg pain.  Denies injury or trauma.

## 2024-04-23 NOTE — ED Provider Triage Note (Signed)
 Emergency Medicine Provider Triage Evaluation Note  Carrie Carpenter , a 59 y.o. female  was evaluated in triage.  Pt complains of bilateral leg pain and weakness that began while working this morning.  Notes pain began in her feet and shoots up her legs.  Pain is bilateral.  Denies fall or injury.  No previous back surgeries.  No recent illnesses including cough, congestion, fevers.  Notes her arms are starting to hurt as well.  Review of Systems  Positive: Leg pain, leg weakness, fatigue Negative: Cough, congestion, chest pain, shortness of breath  Physical Exam  BP (!) 150/111 (BP Location: Right Arm)   Pulse (!) 114   Temp (!) 101.2 F (38.4 C)   Resp 20   Ht 5' 3 (1.6 m)   Wt 55.3 kg   LMP 03/01/2017   SpO2 96%   BMI 21.61 kg/m  Gen:   Awake, no distress   Resp:  Normal effort  MSK:   Legs are bilaterally weak but can move.  DP pulses 2+ bilaterally.  Tender to palpation in the bilateral lower lumbar area.  No deformities, erythema, edema, wounds. Other:    Medical Decision Making  Medically screening exam initiated at 12:34 PM.  Appropriate orders placed.  Luke VEAR Cassis was informed that the remainder of the evaluation will be completed by another provider, this initial triage assessment does not replace that evaluation, and the importance of remaining in the ED until their evaluation is complete.     Neysa Thersia RAMAN, PA-C 04/23/24 1235

## 2024-04-23 NOTE — Sepsis Progress Note (Signed)
 Sepsis protocol monitored by eLink

## 2024-04-23 NOTE — ED Triage Notes (Signed)
 Pt in MRI.

## 2024-04-23 NOTE — ED Notes (Signed)
 Extra DG, SST, Blue & Red tubes drawn

## 2024-04-23 NOTE — ED Notes (Signed)
 Awaiting patient from lobby.

## 2024-04-23 NOTE — ED Triage Notes (Signed)
 Pt coming in with bilateral leg pain pt reports that she can barely stand or walk. Pt denies injury. Pt reports nausea.

## 2024-04-24 ENCOUNTER — Encounter (HOSPITAL_COMMUNITY): Payer: Self-pay | Admitting: Internal Medicine

## 2024-04-24 ENCOUNTER — Observation Stay (HOSPITAL_COMMUNITY)

## 2024-04-24 DIAGNOSIS — J189 Pneumonia, unspecified organism: Secondary | ICD-10-CM | POA: Diagnosis not present

## 2024-04-24 LAB — BASIC METABOLIC PANEL WITH GFR
Anion gap: 10 (ref 5–15)
BUN: 7 mg/dL (ref 6–20)
CO2: 23 mmol/L (ref 22–32)
Calcium: 8.6 mg/dL — ABNORMAL LOW (ref 8.9–10.3)
Chloride: 109 mmol/L (ref 98–111)
Creatinine, Ser: 0.65 mg/dL (ref 0.44–1.00)
GFR, Estimated: >60 mL/min
Glucose, Bld: 87 mg/dL (ref 70–99)
Potassium: 3.4 mmol/L — ABNORMAL LOW (ref 3.5–5.1)
Sodium: 143 mmol/L (ref 135–145)

## 2024-04-24 LAB — CBC
HCT: 37.4 % (ref 36.0–46.0)
Hemoglobin: 12.3 g/dL (ref 12.0–15.0)
MCH: 27.8 pg (ref 26.0–34.0)
MCHC: 32.9 g/dL (ref 30.0–36.0)
MCV: 84.6 fL (ref 80.0–100.0)
Platelets: 199 K/uL (ref 150–400)
RBC: 4.42 MIL/uL (ref 3.87–5.11)
RDW: 14 % (ref 11.5–15.5)
WBC: 10.7 K/uL — ABNORMAL HIGH (ref 4.0–10.5)
nRBC: 0 % (ref 0.0–0.2)

## 2024-04-24 LAB — MAGNESIUM: Magnesium: 2.2 mg/dL (ref 1.7–2.4)

## 2024-04-24 LAB — PHOSPHORUS: Phosphorus: 2.7 mg/dL (ref 2.5–4.6)

## 2024-04-24 MED ORDER — TRAMADOL HCL 50 MG PO TABS
25.0000 mg | ORAL_TABLET | Freq: Once | ORAL | Status: AC
  Administered 2024-04-24: 25 mg via ORAL
  Filled 2024-04-24: qty 1

## 2024-04-24 MED ORDER — BUTALBITAL-APAP-CAFFEINE 50-325-40 MG PO TABS
1.0000 | ORAL_TABLET | Freq: Four times a day (QID) | ORAL | Status: DC | PRN
  Administered 2024-04-24 – 2024-04-25 (×3): 1 via ORAL
  Filled 2024-04-24 (×3): qty 1

## 2024-04-24 MED ORDER — IPRATROPIUM-ALBUTEROL 0.5-2.5 (3) MG/3ML IN SOLN
3.0000 mL | Freq: Three times a day (TID) | RESPIRATORY_TRACT | Status: DC
  Administered 2024-04-25: 3 mL via RESPIRATORY_TRACT
  Filled 2024-04-24: qty 3

## 2024-04-24 MED ORDER — IBUPROFEN 400 MG PO TABS
400.0000 mg | ORAL_TABLET | Freq: Once | ORAL | Status: AC
  Administered 2024-04-24: 400 mg via ORAL
  Filled 2024-04-24: qty 1

## 2024-04-24 MED ORDER — POTASSIUM CHLORIDE CRYS ER 20 MEQ PO TBCR
40.0000 meq | EXTENDED_RELEASE_TABLET | Freq: Once | ORAL | Status: AC
  Administered 2024-04-24: 40 meq via ORAL
  Filled 2024-04-24: qty 2

## 2024-04-24 MED ORDER — IPRATROPIUM-ALBUTEROL 0.5-2.5 (3) MG/3ML IN SOLN: 3.0000 mL | Freq: Three times a day (TID) | RESPIRATORY_TRACT | Status: DC

## 2024-04-24 MED ORDER — METHOCARBAMOL 500 MG PO TABS
500.0000 mg | ORAL_TABLET | Freq: Three times a day (TID) | ORAL | Status: DC | PRN
  Administered 2024-04-24 – 2024-04-25 (×2): 500 mg via ORAL
  Filled 2024-04-24 (×3): qty 1

## 2024-04-24 MED ORDER — MAGNESIUM OXIDE -MG SUPPLEMENT 400 (240 MG) MG PO TABS
400.0000 mg | ORAL_TABLET | Freq: Two times a day (BID) | ORAL | Status: DC
  Administered 2024-04-24 – 2024-04-25 (×3): 400 mg via ORAL
  Filled 2024-04-24 (×3): qty 1

## 2024-04-24 MED ORDER — ACETAMINOPHEN 325 MG PO TABS
650.0000 mg | ORAL_TABLET | Freq: Four times a day (QID) | ORAL | Status: DC | PRN
  Administered 2024-04-24 – 2024-04-25 (×3): 650 mg via ORAL
  Filled 2024-04-24 (×3): qty 2

## 2024-04-24 MED ORDER — LOSARTAN POTASSIUM 25 MG PO TABS
25.0000 mg | ORAL_TABLET | Freq: Every day | ORAL | Status: DC
  Administered 2024-04-24: 25 mg via ORAL
  Filled 2024-04-24: qty 1

## 2024-04-24 MED ORDER — LIDOCAINE 5 % EX PTCH
3.0000 | MEDICATED_PATCH | CUTANEOUS | Status: DC
  Administered 2024-04-24: 1 via TRANSDERMAL
  Administered 2024-04-25: 3 via TRANSDERMAL
  Filled 2024-04-24 (×2): qty 3

## 2024-04-24 NOTE — Hospital Course (Addendum)
 Carrie Carpenter is a 59 y.o. female with PMH of  hypertension, who presents to the ER with complaints of muscle pain of all extremities, subjective fevers, shaking chills, onset abruptly on 1/13 at work without any  productive cough. The patient works at a editor, commissioning. In the ER:: Tmax 102.8.  BP 161/96, pulse 102, respiration rate 24, O2 saturation 100% on room air.  Patient appeared ill, UA negative  Chest x-ray>>mild streaky lower lobe bronchovascular opacities that suggest early bibasilar pneumonia.Lab- leukocytosis 13.5, lactic acid 2.4.Code sepsis called in the ER.  Peripheral blood cultures x 2 collected placed on antibiotic and admitted for further management  Subjective: Seen and examined  Doing well today no neck apin some leg pain, on RA Eager to go home now Overnight afebrile blood pressure elevated Home losartan  resumed.  Labs shows improved leukocytosis stable renal function   Discharge Diagnoses:  Sepsis secondary to early CAP POA: fever 102.8, leukocytosis 13.5, tachycardia 114, lactic acid 2.4, AND CXR bibasilar infiltrates Leukocytosis resolved, Blood culture no growth so far.  Doing well overall and eager to go home today we will discharge her home on oral antibiotics.   Hypertension: Continue home losartan  -follow-up with PCP  Hypokalemia Hypomagnesemia: Resolved   Lactic acidosis High anion gap metabolic acidosis: Resolved  QTc prolongation QTc 633-Avoid QTc prolonging agents, monitor EKG and electrolytes cont tele , Repeat EKG showed QTc 446   Generalized muscular pain Back pain Neck pain MRI lumbar spine without contrast done in the ER revealed degenerative findings without overt impingement at T12-L1, L2-3, L3-4, L4-5 and L5-S1.  Chronic L4 limbus vertebra.  X-ray neck with C4-C5 degenerative disease straightening of the normal cervical lordosis, continue muscle laxer and pain management   DVT prophylaxis: enoxaparin  (LOVENOX ) injection 40 mg  Start: 04/23/24 2030 Code Status:   Code Status: Full Code Family Communication: plan of care discussed with patient at bedside. Patient status is: Remains hospitalized because of severity of illness Level of care: Med-Surg   Dispo: The patient is from: home            Anticipated disposition: Home today   objective: Vitals last 24 hrs: Vitals:   04/24/24 2034 04/25/24 0501 04/25/24 0502 04/25/24 0755  BP: (!) 177/112 (!) 188/108 (!) 173/119 (!) 172/102  Pulse: 83 83 83 79  Resp: 18 18  18   Temp: 98 F (36.7 C) 98.3 F (36.8 C)  98.1 F (36.7 C)  TempSrc: Oral Oral  Oral  SpO2: 98% 97% 96% 97%  Weight:      Height:        Physical Examination: General exam: AAOX3 HEENT:Oral mucosa moist, Ear/Nose WNL grossly Respiratory system: Bilaterally clear BS,no use of accessory muscle Cardiovascular system: S1 & S2 +, No JVD. Gastrointestinal system: Abdomen soft,NT,ND, BS+ Nervous System: Alert, awake, moving all extremities,and following commands. Extremities: extremities warm, leg edema neg Skin: Warm, no rashes MSK: Normal muscle bulk,tone, power

## 2024-04-24 NOTE — Progress Notes (Signed)
" ° °  Brief Progress Note   _____________________________________________________________________________________________________________  Patient Name: Carrie Carpenter Patient DOB: [birth year] Date: @TODAY @      Data:  59 y.o. female currently awaiting admission to a Telemetry bed at [facility].      Action: I reached out to Dr. Christobal to inquire about the possibility of downgrading the patient to a med-surg level of care.    Response:  Dr. Christobal will update the patient's level of care to reflect med-surg status.  _____________________________________________________________________________________________________________  [facility] RN Expeditor Lemmie Vanlanen S Danta Baumgardner Please contact us  directly via secure chat (search for [facility]) or by calling us  at [phone] [facility]).  "

## 2024-04-24 NOTE — Progress Notes (Signed)
 " PROGRESS NOTE LATAJA NEWLAND  FMW:[id number] DOB: 11-17-1965 DOA: 04/23/2024 PCP: Arloa Lamar SQUIBB, MD  Brief Narrative/Hospital Course: DEVRA STARE is a 59 y.o. female with PMH of  hypertension, who presents to the ER with complaints of muscle pain of all extremities, subjective fevers, shaking chills, onset abruptly on 1/13 at work without any  productive cough. The patient works at a [employer], commissioning. In the ER:: Tmax 102.8.  BP 161/96, pulse 102, respiration rate 24, O2 saturation 100% on room air.  Patient appeared ill, UA negative  Chest x-ray>>mild streaky lower lobe bronchovascular opacities that suggest early bibasilar pneumonia.Lab- leukocytosis 13.5, lactic acid 2.4.Code sepsis called in the ER.  Peripheral blood cultures x 2 collected placed on antibiotic and admitted for further management  Subjective: Seen and examined today Patient's family at the bedside, complains of headache neck pain leg pain Overnight  afebrile Tmax 100.8 on admission, VSS, Labs-mild hypokalemia  wBC improving lactic acidosis resolved  Assessment and plan:  Sepsis secondary to early CAP POA: fever 102.8, leukocytosis 13.5, tachycardia 114, lactic acid 2.4, AND CXR bibasilar infiltrates Vitals currently stable, follow-up blood culture, check sputum culture urinary antigens. Continue on antibiotic ceftriaxone  doxycycline , continue bronchodilators antitussives   Hypertension: Stable continue home lisinopril    Hypokalemia Hypomagnesemia: Monitor and replace  Lactic acidosis High anion gap metabolic acidosis: Resolved  QTc prolongation QTc 633-Avoid QTc prolonging agents, monitor EKG and electrolytes cont tele , Repeat EKG shows QTc 446   Generalized muscular pain Back pain Neck pain MRI lumbar spine without contrast done in the ER revealed degenerative findings without overt impingement at T12-L1, L2-3, L3-4, L4-5 and L5-S1.  Chronic L4 limbus vertebra. Pain control, continue, as needed.   Check neck xray  DVT prophylaxis: enoxaparin  (LOVENOX ) injection 40 mg Start: 04/23/24 2030 Code Status:   Code Status: Full Code Family Communication: plan of care discussed with patient at bedside. Patient status is: Remains hospitalized because of severity of illness Level of care: Med-Surg   Dispo: The patient is from: home            Anticipated disposition: TBD Objective: Vitals last 24 hrs: Vitals:   04/24/24 0803 04/24/24 1100 04/24/24 1224 04/24/24 1315  BP:  123/84    Pulse:  79  71  Resp:  20  19  Temp: 98.6 F (37 C)  98.6 F (37 C)   TempSrc: Oral  Oral   SpO2:  100%  100%  Weight:      Height:        Physical Examination: General exam: alert awake, oriented, older than stated age HEENT:Oral mucosa moist, Ear/Nose WNL grossly Respiratory system: Bilaterally clear BS,no use of accessory muscle Cardiovascular system: S1 & S2 +, No JVD. Gastrointestinal system: Abdomen soft,NT,ND, BS+ Nervous System: Alert, awake, moving all extremities,and following commands. Extremities: extremities warm, leg edema neg Skin: Warm, no rashes MSK: Normal muscle bulk,tone, power   Medications reviewed:  Scheduled Meds:  doxycycline   100 mg Oral Q12H   enoxaparin  (LOVENOX ) injection  40 mg Subcutaneous Q24H   ipratropium-albuterol   3 mL Nebulization Q6H   lidocaine   3 patch Transdermal Q24H   magnesium  oxide  400 mg Oral BID   Continuous Infusions:  cefTRIAXone  (ROCEPHIN )  IV     lactated ringers  150 mL/hr at 04/24/24 9247   Diet: Diet Order             Diet Heart Room service appropriate? Yes; Fluid consistency: Thin  Diet effective now  Unresulted Labs (From admission, onward)     Start     Ordered   04/30/24 0500  Creatinine, serum  (enoxaparin  (LOVENOX )    CrCl >/= 30 ml/min)  Weekly,   R     Comments: while on enoxaparin  therapy    04/23/24 2015   04/25/24 0500  Basic metabolic panel with GFR  Tomorrow morning,   R        04/24/24 0813    04/25/24 0500  CBC  Tomorrow morning,   R        04/24/24 0813   04/25/24 0500  Magnesium   Tomorrow morning,   R        04/24/24 1343   04/24/24 0814  Legionella Pneumophila Serogp 1 Ur Ag  Once,   R        04/24/24 0813   04/24/24 0814  Strep pneumoniae urinary antigen  Once,   R        04/24/24 0813   04/24/24 0814  Expectorated Sputum Assessment w Gram Stain, Rflx to Resp Cult  Once,   R        04/24/24 0813           Data Reviewed: I have personally reviewed following labs and imaging studies ( see epic result tab) CBC: Recent Labs  Lab 04/23/24 1233 04/24/24 0350  WBC 13.5* 10.7*  NEUTROABS 11.9*  --   HGB 14.1 12.3  HCT 43.0 37.4  MCV 85.3 84.6  PLT 233 199   CMP: Recent Labs  Lab 04/23/24 1233 04/23/24 1250 04/24/24 0350  NA 138  --  143  K 3.7  --  3.4*  CL 102  --  109  CO2 21*  --  23  GLUCOSE 129*  --  87  BUN 11  --  7  CREATININE 0.72  --  0.65  CALCIUM 9.9  --  8.6*  MG  --  1.6* 2.2  PHOS  --   --  2.7   GFR: Estimated Creatinine Clearance: 64.2 mL/min (by C-G formula based on SCr of 0.65 mg/dL). Recent Labs  Lab 04/23/24 1233  AST 44*  ALT 73*  ALKPHOS 74  BILITOT 0.6  PROT 7.9  ALBUMIN 4.7    Recent Labs  Lab 04/23/24 1233  LIPASE 20   No results for input(s): AMMONIA in the last 168 hours. Coagulation Profile: No results for input(s): INR, PROTIME in the last 168 hours. Antimicrobials/Microbiology: Anti-infectives (From admission, onward)    Start     Dose/Rate Route Frequency Ordered Stop   04/24/24 1830  cefTRIAXone  (ROCEPHIN ) 2 g in sodium chloride  0.9 % 100 mL IVPB        2 g 200 mL/hr over 30 Minutes Intravenous Every 24 hours 04/23/24 2227     04/24/24 0700  doxycycline  (VIBRA -TABS) tablet 100 mg        100 mg Oral Every 12 hours 04/23/24 2232     04/24/24 0400  azithromycin  (ZITHROMAX ) tablet 500 mg  Status:  Discontinued        500 mg Oral Daily 04/23/24 2227 04/23/24 2232   04/23/24 1815  cefTRIAXone   (ROCEPHIN ) 1 g in sodium chloride  0.9 % 100 mL IVPB        1 g 200 mL/hr over 30 Minutes Intravenous  Once 04/23/24 1807 04/23/24 1901   04/23/24 1815  azithromycin  (ZITHROMAX ) tablet 500 mg        500 mg Oral  Once 04/23/24 1807 04/23/24 1901  Component Value Date/Time   SDES BLOOD SITE NOT SPECIFIED 04/23/2024 1420   SPECREQUEST  04/23/2024 1420    BOTTLES DRAWN AEROBIC AND ANAEROBIC Blood Culture results may not be optimal due to an inadequate volume of blood received in culture bottles   CULT  04/23/2024 1420    NO GROWTH < 24 HOURS Performed at [facility], [address] [place], [place] [address]    REPTSTATUS PENDING 04/23/2024 1420    Procedures:  Mennie LAMY, MD [facility] 04/24/2024, 1:44 PM   "

## 2024-04-24 NOTE — Plan of Care (Incomplete)
 Had a seemingly uneventful day. This patient is aware of necessity for continued observation and is aware of care plan and educational needs at this time.  +- HA +-N/V +-Lightheadedness/Dizziness +-GI s/s +-GU s/s +-N/T +-Falls/LOC +-Cough/Congestion   Patient remains alert and oriented to:   Provider listed on chart contacted on x occurrences during shift.

## 2024-04-24 NOTE — ED Notes (Signed)
 Patient transported to X-ray

## 2024-04-25 ENCOUNTER — Other Ambulatory Visit (HOSPITAL_COMMUNITY): Payer: Self-pay

## 2024-04-25 DIAGNOSIS — J189 Pneumonia, unspecified organism: Secondary | ICD-10-CM | POA: Diagnosis not present

## 2024-04-25 LAB — BASIC METABOLIC PANEL WITH GFR
Anion gap: 10 (ref 5–15)
BUN: 7 mg/dL (ref 6–20)
CO2: 25 mmol/L (ref 22–32)
Calcium: 9.1 mg/dL (ref 8.9–10.3)
Chloride: 103 mmol/L (ref 98–111)
Creatinine, Ser: 0.7 mg/dL (ref 0.44–1.00)
GFR, Estimated: >60 mL/min
Glucose, Bld: 82 mg/dL (ref 70–99)
Potassium: 3.8 mmol/L (ref 3.5–5.1)
Sodium: 138 mmol/L (ref 135–145)

## 2024-04-25 LAB — CBC
HCT: 38.3 % (ref 36.0–46.0)
Hemoglobin: 12.5 g/dL (ref 12.0–15.0)
MCH: 27.7 pg (ref 26.0–34.0)
MCHC: 32.6 g/dL (ref 30.0–36.0)
MCV: 84.9 fL (ref 80.0–100.0)
Platelets: 199 K/uL (ref 150–400)
RBC: 4.51 MIL/uL (ref 3.87–5.11)
RDW: 14.3 % (ref 11.5–15.5)
WBC: 6.6 K/uL (ref 4.0–10.5)
nRBC: 0 % (ref 0.0–0.2)

## 2024-04-25 LAB — MAGNESIUM: Magnesium: 2.1 mg/dL (ref 1.7–2.4)

## 2024-04-25 MED ORDER — DOXYCYCLINE HYCLATE 100 MG PO TABS
100.0000 mg | ORAL_TABLET | Freq: Two times a day (BID) | ORAL | 0 refills | Status: AC
  Filled 2024-04-25: qty 8, 4d supply, fill #0

## 2024-04-25 MED ORDER — METHOCARBAMOL 500 MG PO TABS
500.0000 mg | ORAL_TABLET | Freq: Three times a day (TID) | ORAL | 0 refills | Status: AC | PRN
  Filled 2024-04-25: qty 30, 10d supply, fill #0

## 2024-04-25 MED ORDER — CEFDINIR 300 MG PO CAPS
300.0000 mg | ORAL_CAPSULE | Freq: Two times a day (BID) | ORAL | 0 refills | Status: AC
  Filled 2024-04-25: qty 8, 4d supply, fill #0

## 2024-04-25 NOTE — Discharge Summary (Addendum)
 Physician Discharge Summary  NAVAH GRONDIN FMW:[id number] DOB: [birth year] DOA: 04/23/2024  PCP: Arloa Lamar SQUIBB, MD  Admit date: 04/23/2024 Discharge date: 04/25/2024 Recommendations for Outpatient Follow-up:  Follow up with PCP in 1 weeks-call for appointment Please obtain BMP/CBC in one week  Discharge Dispo: Home Discharge Condition: Stable Code Status:   Code Status: Full Code Diet recommendation:  Diet Order             Diet Heart Room service appropriate? Yes; Fluid consistency: Thin  Diet effective now                    Brief/Interim Summary: Carrie Carpenter is a 59 y.o. female with PMH of  hypertension, who presents to the ER with complaints of muscle pain of all extremities, subjective fevers, shaking chills, onset abruptly on 1/13 at work without any  productive cough. The patient works at a editor, commissioning. In the ER:: Tmax 102.8.  BP 161/96, pulse 102, respiration rate 24, O2 saturation 100% on room air.  Patient appeared ill, UA negative  Chest x-ray>>mild streaky lower lobe bronchovascular opacities that suggest early bibasilar pneumonia.Lab- leukocytosis 13.5, lactic acid 2.4.Code sepsis called in the ER.  Peripheral blood cultures x 2 collected placed on antibiotic and admitted for further management  Subjective: Seen and examined  Doing well today no neck apin some leg pain, on RA Eager to go home now Overnight afebrile blood pressure elevated Home losartan  resumed.  Labs shows improved leukocytosis stable renal function   Discharge Diagnoses:  Sepsis secondary to early CAP POA: fever 102.8, leukocytosis 13.5, tachycardia 114, lactic acid 2.4, AND CXR bibasilar infiltrates Leukocytosis resolved, Blood culture no growth so far.  Doing well overall and eager to go home today we will discharge her home on oral antibiotics.   Hypertension: Continue home losartan  -follow-up with PCP  Hypokalemia Hypomagnesemia: Resolved   Lactic acidosis High anion gap  metabolic acidosis: Resolved  QTc prolongation QTc 633-Avoid QTc prolonging agents, monitor EKG and electrolytes cont tele , Repeat EKG showed QTc 446   Generalized muscular pain Back pain Neck pain MRI lumbar spine without contrast done in the ER revealed degenerative findings without overt impingement at T12-L1, L2-3, L3-4, L4-5 and L5-S1.  Chronic L4 limbus vertebra.  X-ray neck with C4-C5 degenerative disease straightening of the normal cervical lordosis, continue muscle laxer and pain management   DVT prophylaxis: enoxaparin  (LOVENOX ) injection 40 mg Start: 04/23/24 2030 Code Status:   Code Status: Full Code Family Communication: plan of care discussed with patient at bedside. Patient status is: Remains hospitalized because of severity of illness Level of care: Med-Surg   Dispo: The patient is from: home            Anticipated disposition: Home today   objective: Vitals last 24 hrs: Vitals:   04/24/24 2034 04/25/24 0501 04/25/24 0502 04/25/24 0755  BP: (!) 177/112 (!) 188/108 (!) 173/119 (!) 172/102  Pulse: 83 83 83 79  Resp: 18 18  18   Temp: 98 F (36.7 C) 98.3 F (36.8 C)  98.1 F (36.7 C)  TempSrc: Oral Oral  Oral  SpO2: 98% 97% 96% 97%  Weight:      Height:        Physical Examination: General exam: AAOX3 HEENT:Oral mucosa moist, Ear/Nose WNL grossly Respiratory system: Bilaterally clear BS,no use of accessory muscle Cardiovascular system: S1 & S2 +, No JVD. Gastrointestinal system: Abdomen soft,NT,ND, BS+ Nervous System: Alert, awake, moving all extremities,and following  commands. Extremities: extremities warm, leg edema neg Skin: Warm, no rashes MSK: Normal muscle bulk,tone, power      Consultation: See note.  Discharge Instructions  Discharge Instructions     Discharge instructions   Complete by: As directed    Please call call MD or return to ER for similar or worsening recurring problem that brought you to hospital or if any  fever,nausea/vomiting,abdominal pain, uncontrolled pain, chest pain,  shortness of breath or any other alarming symptoms.  Please follow-up your doctor as instructed in a week time and call the office for appointment.  Please avoid alcohol, smoking, or any other illicit substance and maintain healthy habits including taking your regular medications as prescribed.  You were cared for by a hospitalist during your hospital stay. If you have any questions about your discharge medications or the care you received while you were in the hospital after you are discharged, you can call the unit and ask to speak with the hospitalist on call if the hospitalist that took care of you is not available.  Once you are discharged, your primary care physician will handle any further medical issues. Please note that NO REFILLS for any discharge medications will be authorized once you are discharged, as it is imperative that you return to your primary care physician (or establish a relationship with a primary care physician if you do not have one) for your aftercare needs so that they can reassess your need for medications and monitor your lab values   Increase activity slowly   Complete by: As directed       Allergies as of 04/25/2024       Reactions   Lisinopril  Cough        Medication List     STOP taking these medications    famotidine  20 MG tablet Commonly known as: PEPCID    orphenadrine 100 MG tablet Commonly known as: NORFLEX   pantoprazole 40 MG tablet Commonly known as: PROTONIX       TAKE these medications    acetaminophen  650 MG CR tablet Commonly known as: TYLENOL  Take 1,300 mg by mouth every 8 (eight) hours as needed for pain or fever.   cefdinir  300 MG capsule Commonly known as: OMNICEF  Take 1 capsule (300 mg total) by mouth 2 (two) times daily for 4 days.   doxycycline  100 MG tablet Commonly known as: VIBRA -TABS Take 1 tablet (100 mg total) by mouth every 12 (twelve)  hours for 4 days.   fenofibrate 54 MG tablet Take 54 mg by mouth at bedtime.   gabapentin 300 MG capsule Commonly known as: NEURONTIN Take 300 mg by mouth 3 (three) times daily.   losartan  25 MG tablet Commonly known as: COZAAR  Take 25 mg by mouth at bedtime.   methocarbamol  500 MG tablet Commonly known as: ROBAXIN  Take 1 tablet (500 mg total) by mouth every 8 (eight) hours as needed for muscle spasms.   omeprazole 40 MG capsule Commonly known as: PRILOSEC Take 40 mg by mouth at bedtime.   rosuvastatin 10 MG tablet Commonly known as: CRESTOR Take 10 mg by mouth at bedtime.        Follow-up Information     Arloa Lamar SQUIBB, MD Follow up in 1 week(s).   Specialty: Obstetrics and Gynecology Contact information: [address] [place] [address] [phone]                Allergies[1]  The results of significant diagnostics from this hospitalization (including imaging, microbiology,  ancillary and laboratory) are listed below for reference.    Microbiology: Recent Results (from the past 240 hours)  Culture, blood (routine x 2)     Status: None (Preliminary result)   Collection Time: 04/23/24 12:33 PM   Specimen: BLOOD  Result Value Ref Range Status   Specimen Description BLOOD RIGHT ANTECUBITAL  Final   Special Requests   Final    BOTTLES DRAWN AEROBIC AND ANAEROBIC Blood Culture adequate volume   Culture   Final    NO GROWTH 2 DAYS Performed at [facility], [address], [place], [place] [address]    Report Status PENDING  Incomplete  Resp panel by RT-PCR (RSV, Flu A&B, Covid) Anterior Nasal Swab     Status: None   Collection Time: 04/23/24 12:35 PM   Specimen: Anterior Nasal Swab  Result Value Ref Range Status   SARS Coronavirus 2 by RT PCR NEGATIVE NEGATIVE Final   Influenza A by PCR NEGATIVE NEGATIVE Final   Influenza B by PCR NEGATIVE NEGATIVE Final    Comment: (NOTE) The Xpert Xpress SARS-CoV-2/FLU/RSV plus assay is intended as  an aid in the diagnosis of influenza from Nasopharyngeal swab specimens and should not be used as a sole basis for treatment. Nasal washings and aspirates are unacceptable for Xpert Xpress SARS-CoV-2/FLU/RSV testing.  Fact Sheet for Patients: bloggercourse.com  Fact Sheet for Healthcare Providers: seriousbroker.it  This test is not yet approved or cleared by the [place]  FDA and has been authorized for detection and/or diagnosis of SARS-CoV-2 by FDA under an Emergency Use Authorization (EUA). This EUA will remain in effect (meaning this test can be used) for the duration of the COVID-19 declaration under Section 564(b)(1) of the Act, 21 U.S.C. section 360bbb-3(b)(1), unless the authorization is terminated or revoked.     Resp Syncytial Virus by PCR NEGATIVE NEGATIVE Final    Comment: (NOTE) Fact Sheet for Patients: bloggercourse.com  Fact Sheet for Healthcare Providers: seriousbroker.it  This test is not yet approved or cleared by the [place]  FDA and has been authorized for detection and/or diagnosis of SARS-CoV-2 by FDA under an Emergency Use Authorization (EUA). This EUA will remain in effect (meaning this test can be used) for the duration of the COVID-19 declaration under Section 564(b)(1) of the Act, 21 U.S.C. section 360bbb-3(b)(1), unless the authorization is terminated or revoked.  Performed at [facility], [address] [place], [place] [address]   Culture, blood (routine x 2)     Status: None (Preliminary result)   Collection Time: 04/23/24  2:20 PM   Specimen: BLOOD  Result Value Ref Range Status   Specimen Description BLOOD SITE NOT SPECIFIED  Final   Special Requests   Final    BOTTLES DRAWN AEROBIC AND ANAEROBIC Blood Culture results may not be optimal due to an inadequate volume of blood received in culture bottles   Culture   Final    NO  GROWTH 2 DAYS Performed at [facility], [address] [place], [place] [address]    Report Status PENDING  Incomplete    Procedures/Studies: DG Cervical Spine 2 or 3 views Result Date: 04/24/2024 CLINICAL DATA:  Posterior neck pain. EXAM: CERVICAL SPINE - 2-3 VIEW COMPARISON:  None Available. FINDINGS: Cervical spine is visualized from the occiput to the cervicothoracic junction. Straightening of the cervical lordosis. No subluxation or fracture. Prevertebral soft tissues are within normal limits. Mild anterior marginal osteophytosis at C4-5. Multilevel uncovertebral hypertrophy and facet sclerosis. Disc  space heights are relatively well maintained with exception of C4-5 where there is minimal loss of disc space height. Visualized lung apices are clear. IMPRESSION: 1. Straightening of the normal cervical lordosis. 2. Mild C4-5 degenerative disc disease. 3. Multilevel mild uncovertebral hypertrophy and facet sclerosis. Electronically Signed   By: Newell Eke M.D.   On: 04/24/2024 14:36   DG Chest 1 View Result Date: 04/23/2024 CLINICAL DATA:  Fever EXAM: CHEST  1 VIEW COMPARISON:  08/08/2014 FINDINGS: Stable heart size and vascularity. Mild increased streaky lower lobe bronchovascular opacities compared to the prior study may represent early bibasilar pneumonia pattern. Negative for edema, effusion or pneumothorax. Trachea midline. Artifact overlies the right upper lobe. No osseous abnormality. IMPRESSION: Mild streaky lower lobe bronchovascular opacities suggest early bibasilar pneumonia. Electronically Signed   By: CHRISTELLA.  Shick M.D.   On: 04/23/2024 16:10   MR LUMBAR SPINE WO CONTRAST Result Date: 04/23/2024 EXAM: MRI LUMBAR SPINE 04/23/2024 03:21:01 PM TECHNIQUE: Multiplanar multisequence MRI of the lumbar spine was performed without the administration of intravenous contrast. COMPARISON: CT abdomen 11/24/2017. 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 FINDINGS: BONES AND ALIGNMENT: Normal alignment. Normal vertebral body heights. Background marrow signal is unremarkable unless otherwise stated. Limbus vertebra at L4. SPINAL CORD: The conus terminates normally at the T12-L1 level. SOFT TISSUES: No paraspinal mass. T12-L1: Small left paracentral disc protrusion. No impingement. L1-L2: Unremarkable. L2-L3: Mild disc bulge. No impingement. L3-L4: Mild disc bulge. Disc desiccation. No impingement. L4-L5: Mild disc bulge. Disc desiccation. No impingement. L5-S1: Disc bulge. Mild degenerative facet arthropathy. Disc desiccation. No impingement. IMPRESSION: 1. Degenerative findings without overt impingement at T12-L1, L2-3, L3-4, L4-5, and L5-S1. 2. Chronic L4 limbus vertebra. Electronically signed by: Ryan Salvage MD 04/23/2024 03:39 PM EST RP Workstation: [id number]   MM 3D SCREENING MAMMOGRAM BILATERAL BREAST Result Date: 04/10/2024 CLINICAL DATA:  Screening. EXAM: DIGITAL SCREENING BILATERAL MAMMOGRAM WITH TOMOSYNTHESIS AND CAD TECHNIQUE: Bilateral screening digital craniocaudal and mediolateral oblique mammograms were obtained. Bilateral screening digital breast tomosynthesis was performed. The images were evaluated with computer-aided detection. COMPARISON:  Previous exam(s). ACR Breast Density Category b: There are scattered areas of fibroglandular density. FINDINGS: There are no findings suspicious for malignancy. IMPRESSION: No mammographic evidence of malignancy. A result letter of this screening mammogram will be mailed directly to the patient. RECOMMENDATION: Screening mammogram in one year. (Code:SM-B-01Y) BI-RADS CATEGORY  1: Negative. Electronically Signed   By: Debby Satterfield M.D.   On: 04/10/2024 12:59    Labs: BNP (last 3 results) No results for input(s): BNP in the last 8760 hours. Basic Metabolic Panel: Recent Labs  Lab 04/23/24 1233 04/23/24 1250 04/24/24 0350 04/25/24 0326  NA 138  --  143 138  K 3.7  --   3.4* 3.8  CL 102  --  109 103  CO2 21*  --  23 25  GLUCOSE 129*  --  87 82  BUN 11  --  7 7  CREATININE 0.72  --  0.65 0.70  CALCIUM 9.9  --  8.6* 9.1  MG  --  1.6* 2.2 2.1  PHOS  --   --  2.7  --    Liver Function Tests: Recent Labs  Lab 04/23/24 1233  AST 44*  ALT 73*  ALKPHOS 74  BILITOT 0.6  PROT 7.9  ALBUMIN 4.7   Recent Labs  Lab 04/23/24 1233  LIPASE 20   No results for input(s): AMMONIA in the last 168 hours. CBC:  Recent Labs  Lab 04/23/24 1233 04/24/24 0350 04/25/24 0326  WBC 13.5* 10.7* 6.6  NEUTROABS 11.9*  --   --   HGB 14.1 12.3 12.5  HCT 43.0 37.4 38.3  MCV 85.3 84.6 84.9  PLT 233 199 199   CBG: No results for input(s): GLUCAP in the last 168 hours. Hgb A1c No results for input(s): HGBA1C in the last 72 hours. Anemia work up No results for input(s): VITAMINB12, FOLATE, FERRITIN, TIBC, IRON, RETICCTPCT in the last 72 hours.  Cardiac Enzymes: Recent Labs  Lab 04/23/24 1753  CKTOTAL 114   BNP: Invalid input(s): POCBNP D-Dimer No results for input(s): DDIMER in the last 72 hours. Lipid Profile No results for input(s): CHOL, HDL, LDLCALC, TRIG, CHOLHDL, LDLDIRECT in the last 72 hours. Thyroid function studies No results for input(s): TSH, T4TOTAL, T3FREE, THYROIDAB in the last 72 hours.  Invalid input(s): FREET3 Urinalysis    Component Value Date/Time   COLORURINE STRAW (A) 04/23/2024 1317   APPEARANCEUR CLEAR 04/23/2024 1317   LABSPEC 1.009 04/23/2024 1317   PHURINE 7.0 04/23/2024 1317   GLUCOSEU NEGATIVE 04/23/2024 1317   HGBUR NEGATIVE 04/23/2024 1317   BILIRUBINUR NEGATIVE 04/23/2024 1317   BILIRUBINUR neg 11/06/2017 0915   KETONESUR NEGATIVE 04/23/2024 1317   PROTEINUR NEGATIVE 04/23/2024 1317   UROBILINOGEN 1.0 11/06/2017 0915   NITRITE NEGATIVE 04/23/2024 1317   LEUKOCYTESUR NEGATIVE 04/23/2024 1317   Sepsis Labs Recent Labs  Lab 04/23/24 1233 04/24/24 0350 04/25/24 0326   WBC 13.5* 10.7* 6.6   Microbiology Recent Results (from the past 240 hours)  Culture, blood (routine x 2)     Status: None (Preliminary result)   Collection Time: 04/23/24 12:33 PM   Specimen: BLOOD  Result Value Ref Range Status   Specimen Description BLOOD RIGHT ANTECUBITAL  Final   Special Requests   Final    BOTTLES DRAWN AEROBIC AND ANAEROBIC Blood Culture adequate volume   Culture   Final    NO GROWTH 2 DAYS Performed at [facility], [address], [place], [place] [address]    Report Status PENDING  Incomplete  Resp panel by RT-PCR (RSV, Flu A&B, Covid) Anterior Nasal Swab     Status: None   Collection Time: 04/23/24 12:35 PM   Specimen: Anterior Nasal Swab  Result Value Ref Range Status   SARS Coronavirus 2 by RT PCR NEGATIVE NEGATIVE Final   Influenza A by PCR NEGATIVE NEGATIVE Final   Influenza B by PCR NEGATIVE NEGATIVE Final    Comment: (NOTE) The Xpert Xpress SARS-CoV-2/FLU/RSV plus assay is intended as an aid in the diagnosis of influenza from Nasopharyngeal swab specimens and should not be used as a sole basis for treatment. Nasal washings and aspirates are unacceptable for Xpert Xpress SARS-CoV-2/FLU/RSV testing.  Fact Sheet for Patients: bloggercourse.com  Fact Sheet for Healthcare Providers: seriousbroker.it  This test is not yet approved or cleared by the [place]  FDA and has been authorized for detection and/or diagnosis of SARS-CoV-2 by FDA under an Emergency Use Authorization (EUA). This EUA will remain in effect (meaning this test can be used) for the duration of the COVID-19 declaration under Section 564(b)(1) of the Act, 21 U.S.C. section 360bbb-3(b)(1), unless the authorization is terminated or revoked.     Resp Syncytial Virus by PCR NEGATIVE NEGATIVE Final    Comment: (NOTE) Fact Sheet for Patients: bloggercourse.com  Fact Sheet for Healthcare  Providers: seriousbroker.it  This test is not yet approved or cleared by the [place]  FDA  and has been authorized for detection and/or diagnosis of SARS-CoV-2 by FDA under an Emergency Use Authorization (EUA). This EUA will remain in effect (meaning this test can be used) for the duration of the COVID-19 declaration under Section 564(b)(1) of the Act, 21 U.S.C. section 360bbb-3(b)(1), unless the authorization is terminated or revoked.  Performed at [facility], [address], [place], [place] [address]   Culture, blood (routine x 2)     Status: None (Preliminary result)   Collection Time: 04/23/24  2:20 PM   Specimen: BLOOD  Result Value Ref Range Status   Specimen Description BLOOD SITE NOT SPECIFIED  Final   Special Requests   Final    BOTTLES DRAWN AEROBIC AND ANAEROBIC Blood Culture results may not be optimal due to an inadequate volume of blood received in culture bottles   Culture   Final    NO GROWTH 2 DAYS Performed at [facility], [address] [place], [place] [address]    Report Status PENDING  Incomplete     Time coordinating discharge: 25  minutes  SIGNED: Mennie LAMY, MD  [facility] 04/25/2024, 11:24 AM  If 7PM-7AM, please contact night-coverage [email or web address]       [1]  Allergies Allergen Reactions   Lisinopril  Cough

## 2024-04-25 NOTE — TOC Transition Note (Signed)
 Transition of Care [facility]) - Discharge Note   Patient Details  Name: Carrie Carpenter MRN: [id number] Date of Birth: 05-29-1965  Transition of Care [facility]) CM/SW Contact:  Tom-Johnson, Aziya Arena Daphne, RN Phone Number: 04/25/2024, 11:15 AM   Clinical Narrative:     Patient is scheduled for discharge today.  Readmission Risk Assessment done. Outpatient f/u, hospital f/u and discharge instructions on AVS. Prescriptions sent to [facility] pharmacy and patient will receive meds prior discharge. No ICM needs or recommendations noted. Husband, Narom at bedside and will transport at discharge.  No further ICM needs noted.      Final next level of care: Home/Self Care Barriers to Discharge: Barriers Resolved   Patient Goals and CMS Choice Patient states their goals for this hospitalization and ongoing recovery are:: To return home CMS Medicare.gov Compare Post Acute Care list provided to:: Patient Choice offered to / list presented to : NA      Discharge Placement                Patient to be transferred to facility by: Husband Name of family member notified: Narom    Discharge Plan and Services Additional resources added to the After Visit Summary for                  DME Arranged: N/A DME Agency: NA       HH Arranged: NA HH Agency: NA        Social Drivers of Health (SDOH) Interventions SDOH Screenings   Food Insecurity: No Food Insecurity (04/24/2024)  Housing: Low Risk (04/24/2024)  Transportation Needs: No Transportation Needs (04/24/2024)  Utilities: Not At Risk (04/24/2024)  Social Connections: Socially Integrated (04/24/2024)  Tobacco Use: Low Risk (04/24/2024)     Readmission Risk Interventions    04/25/2024   11:14 AM  Readmission Risk Prevention Plan  Post Dischage Appt Complete  Medication Screening Complete  Transportation Screening Complete

## 2024-04-26 LAB — LEGIONELLA PNEUMOPHILA SEROGP 1 UR AG: L. pneumophila Serogp 1 Ur Ag: NEGATIVE

## 2024-04-28 LAB — CULTURE, BLOOD (ROUTINE X 2)
Culture: NO GROWTH
Culture: NO GROWTH
Special Requests: ADEQUATE

## 2024-05-02 ENCOUNTER — Encounter: Admitting: Obstetrics
